# Patient Record
Sex: Female | Born: 1995 | Race: White | Hispanic: No | Marital: Single | Smoking: Former smoker
Health system: Southern US, Community
[De-identification: ages and names within clinical notes are randomized; demographics above are authoritative.]

## PROBLEM LIST (undated history)

## (undated) DIAGNOSIS — F329 Major depressive disorder, single episode, unspecified: Secondary | ICD-10-CM

## (undated) DIAGNOSIS — Z789 Other specified health status: Secondary | ICD-10-CM

## (undated) DIAGNOSIS — B999 Unspecified infectious disease: Secondary | ICD-10-CM

## (undated) DIAGNOSIS — N809 Endometriosis, unspecified: Secondary | ICD-10-CM

## (undated) DIAGNOSIS — E282 Polycystic ovarian syndrome: Secondary | ICD-10-CM

## (undated) DIAGNOSIS — F419 Anxiety disorder, unspecified: Secondary | ICD-10-CM

## (undated) DIAGNOSIS — L732 Hidradenitis suppurativa: Secondary | ICD-10-CM

## (undated) DIAGNOSIS — F32A Depression, unspecified: Secondary | ICD-10-CM

## (undated) HISTORY — DX: Hidradenitis suppurativa: L73.2

## (undated) HISTORY — PX: WISDOM TOOTH EXTRACTION: SHX21

## (undated) HISTORY — PX: NO PAST SURGERIES: SHX2092

## (undated) HISTORY — DX: Anxiety disorder, unspecified: F41.9

---
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
## 2016-06-09 ENCOUNTER — Encounter (HOSPITAL_COMMUNITY): Payer: Self-pay | Admitting: Emergency Medicine

## 2016-06-09 ENCOUNTER — Inpatient Hospital Stay (HOSPITAL_COMMUNITY)
Admission: AD | Admit: 2016-06-09 | Discharge: 2016-06-13 | DRG: 881 | Disposition: A | Discharge status: Home / Self Care | Payer: Federal, State, Local not specified - PPO | Source: Intra-hospital | Attending: Psychiatry | Admitting: Psychiatry

## 2016-06-09 DIAGNOSIS — F4310 Post-traumatic stress disorder, unspecified: Secondary | ICD-10-CM | POA: Diagnosis present

## 2016-06-09 DIAGNOSIS — F1210 Cannabis abuse, uncomplicated: Secondary | ICD-10-CM | POA: Diagnosis present

## 2016-06-09 DIAGNOSIS — K219 Gastro-esophageal reflux disease without esophagitis: Secondary | ICD-10-CM | POA: Diagnosis present

## 2016-06-09 DIAGNOSIS — F17200 Nicotine dependence, unspecified, uncomplicated: Secondary | ICD-10-CM | POA: Diagnosis present

## 2016-06-09 DIAGNOSIS — G4700 Insomnia, unspecified: Secondary | ICD-10-CM | POA: Diagnosis present

## 2016-06-09 DIAGNOSIS — N390 Urinary tract infection, site not specified: Secondary | ICD-10-CM | POA: Diagnosis present

## 2016-06-09 DIAGNOSIS — Z79899 Other long term (current) drug therapy: Secondary | ICD-10-CM | POA: Diagnosis not present

## 2016-06-09 DIAGNOSIS — F322 Major depressive disorder, single episode, severe without psychotic features: Secondary | ICD-10-CM | POA: Diagnosis not present

## 2016-06-09 DIAGNOSIS — R45851 Suicidal ideations: Secondary | ICD-10-CM | POA: Diagnosis present

## 2016-06-09 DIAGNOSIS — F17210 Nicotine dependence, cigarettes, uncomplicated: Secondary | ICD-10-CM | POA: Diagnosis not present

## 2016-06-09 DIAGNOSIS — Z818 Family history of other mental and behavioral disorders: Secondary | ICD-10-CM | POA: Diagnosis not present

## 2016-06-09 DIAGNOSIS — F4001 Agoraphobia with panic disorder: Secondary | ICD-10-CM | POA: Diagnosis present

## 2016-06-09 DIAGNOSIS — F1010 Alcohol abuse, uncomplicated: Secondary | ICD-10-CM | POA: Diagnosis present

## 2016-06-09 DIAGNOSIS — Z9114 Patient's other noncompliance with medication regimen: Secondary | ICD-10-CM | POA: Diagnosis not present

## 2016-06-09 DIAGNOSIS — F329 Major depressive disorder, single episode, unspecified: Secondary | ICD-10-CM | POA: Diagnosis present

## 2016-06-09 HISTORY — DX: Other specified health status: Z78.9

## 2016-06-09 MED ORDER — NICOTINE POLACRILEX 2 MG MT GUM
2.0000 mg | CHEWING_GUM | OROMUCOSAL | Status: DC | PRN
  Administered 2016-06-09 – 2016-06-12 (×8): 2 mg via ORAL
  Filled 2016-06-09 (×2): qty 1
  Filled 2016-06-09: qty 7
  Filled 2016-06-09 (×4): qty 1

## 2016-06-09 MED ORDER — ALUM & MAG HYDROXIDE-SIMETH 200-200-20 MG/5ML PO SUSP
30.0000 mL | ORAL | Status: DC | PRN
  Administered 2016-06-10 – 2016-06-13 (×3): 30 mL via ORAL
  Filled 2016-06-09 (×3): qty 30

## 2016-06-09 MED ORDER — MAGNESIUM HYDROXIDE 400 MG/5ML PO SUSP
30.0000 mL | Freq: Every day | ORAL | Status: DC | PRN
  Administered 2016-06-12: 30 mL via ORAL
  Filled 2016-06-09: qty 30

## 2016-06-09 MED ORDER — ACETAMINOPHEN 325 MG PO TABS
650.0000 mg | ORAL_TABLET | Freq: Four times a day (QID) | ORAL | Status: DC | PRN
  Administered 2016-06-11 – 2016-06-13 (×3): 650 mg via ORAL
  Filled 2016-06-09 (×3): qty 2

## 2016-06-09 MED ORDER — CIPROFLOXACIN HCL 500 MG PO TABS
500.0000 mg | ORAL_TABLET | Freq: Two times a day (BID) | ORAL | Status: DC
  Administered 2016-06-09 – 2016-06-13 (×8): 500 mg via ORAL
  Filled 2016-06-09 (×13): qty 1

## 2016-06-09 MED ORDER — ONDANSETRON HCL 4 MG PO TABS
4.0000 mg | ORAL_TABLET | Freq: Three times a day (TID) | ORAL | Status: DC | PRN
  Administered 2016-06-09 – 2016-06-13 (×6): 4 mg via ORAL
  Filled 2016-06-09 (×6): qty 1

## 2016-06-09 MED ORDER — ESCITALOPRAM OXALATE 10 MG PO TABS
10.0000 mg | ORAL_TABLET | Freq: Every day | ORAL | Status: DC
  Filled 2016-06-09 (×3): qty 1

## 2016-06-09 MED ORDER — ESCITALOPRAM OXALATE 10 MG PO TABS
10.0000 mg | ORAL_TABLET | Freq: Every day | ORAL | Status: DC
  Administered 2016-06-09 – 2016-06-10 (×2): 10 mg via ORAL
  Filled 2016-06-09 (×4): qty 1

## 2016-06-09 MED ORDER — ENSURE ENLIVE PO LIQD
237.0000 mL | Freq: Two times a day (BID) | ORAL | Status: DC
  Administered 2016-06-10 – 2016-06-11 (×2): 237 mL via ORAL

## 2016-06-09 MED ORDER — TRAZODONE HCL 50 MG PO TABS
50.0000 mg | ORAL_TABLET | Freq: Every evening | ORAL | Status: DC | PRN
  Administered 2016-06-09 – 2016-06-10 (×2): 50 mg via ORAL
  Filled 2016-06-09 (×2): qty 1
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
## 2016-06-09 NOTE — Progress Notes (Signed)
Adult Psychoeducational Group Note  Date:  06/09/2016 Time:  10:10 PM  Group Topic/Focus:  Wrap-Up Group:   The focus of this group is to help patients review their daily goal of treatment and discuss progress on daily workbooks.   Participation Level:  Active  Participation Quality:  Appropriate  Affect:  Appropriate  Cognitive:  Alert  Insight: Appropriate  Engagement in Group:  Engaged  Modes of Intervention:  Discussion  Additional Comments:  Patient admitted today. Patient states she did not have a bad day, but it is very different being here. Gurnoor Sloop L Yuvan Medinger 06/09/2016, 10:10 PM

## 2016-06-10 DIAGNOSIS — F4310 Post-traumatic stress disorder, unspecified: Secondary | ICD-10-CM

## 2016-06-10 DIAGNOSIS — F322 Major depressive disorder, single episode, severe without psychotic features: Secondary | ICD-10-CM

## 2016-06-10 MED ORDER — PANTOPRAZOLE SODIUM 40 MG PO TBEC
40.0000 mg | DELAYED_RELEASE_TABLET | Freq: Every day | ORAL | Status: DC
Start: 2016-06-10 — End: 2016-06-13
  Administered 2016-06-10 – 2016-06-13 (×4): 40 mg via ORAL
  Filled 2016-06-10 (×6): qty 1

## 2016-06-10 NOTE — Progress Notes (Signed)
Recreation Therapy Notes  Date: 06/10/16 Time: 0945 Location: 300 Hall Group Room  Group Topic: Stress Management  Goal Area(s) Addresses:  Patient will verbalize importance of using healthy stress management.  Patient will identify positive emotions associated with healthy stress management.   Intervention: Stress Management  Activity :  Progressive Muscle Relaxation.  LRT introduced the technique of progressive muscle relaxation to patients.  Patients were to follow along as LRT read script to engaged in the technique.  Education:  Stress Management, Discharge Planning.   Education Outcome: Acknowledges edcuation/In group clarification offered/Needs additional education  Clinical Observations/Feedback: Pt did not attend group.    Breely Panik, LRT/CTRS    Jahzir Strohmeier A 06/10/2016 12:46 PM 

## 2016-06-10 NOTE — [facility] Group Notes (Signed)
[facility] LCSW Group Therapy 06/10/2016 1:15pm  Type of Therapy: Group Therapy- Feelings Around Relapse and Recovery  Participation Level: Minimal  Participation Quality:  Attentive  Affect:  Appropriate  Cognitive: Alert and Oriented   Insight:  Unable to assess  Engagement in Therapy: Minimal  Modes of Intervention: Clarification, Confrontation, Discussion, Education, Exploration, Limit-setting, Orientation, Problem-solving, Rapport Building, Dance movement psychotherapisteality Testing, Socialization and Support  Summary of Progress/Problems: The topic for today was feelings about relapse. The group discussed what relapse prevention is to them and identified triggers that they are on the path to relapse. Members also processed their feeling towards relapse and were able to relate to common experiences. Group also discussed coping skills that can be used for relapse prevention.     Therapeutic Modalities:   Cognitive Behavioral Therapy Solution-Focused Therapy Assertiveness Training Relapse Prevention Therapy    Marilyn SprinklesLauren Richardson, LCSWA [id number] 06/10/2016 6:15 PM
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
## 2016-06-10 NOTE — Tx Team (Signed)
Interdisciplinary Treatment and Diagnostic Plan Update  06/10/2016 Time of Session: 4:32 PM  Marilyn Richardson MRN: [id number]  Principal Diagnosis: Post-Traumatic Stress Disorder  Secondary Diagnoses: Active Problems:   MDD (major depressive disorder) (HCC)   Current Medications:  Current Facility-Administered Medications  Medication Dose Route Frequency Provider Last Rate Last Dose  . acetaminophen (TYLENOL) tablet 650 mg  650 mg Oral Q6H PRN Sheila Agustin, NP      . alum & mag hydroxide-simeth (MAALOX/MYLANTA) 200-200-20 MG/5ML suspension 30 mL  30 mL Oral Q4H PRN Sheila Agustin, NP      . ciprofloxacin (CIPRO) tablet 500 mg  500 mg Oral BID Sheila Agustin, NP   500 mg at 06/10/16 0822  . escitalopram (LEXAPRO) tablet 10 mg  10 mg Oral QHS Sheila Agustin, NP   10 mg at 06/09/16 2118  . feeding supplement (ENSURE ENLIVE) (ENSURE ENLIVE) liquid 237 mL  237 mL Oral BID BM Sheila Agustin, NP      . magnesium hydroxide (MILK OF MAGNESIA) suspension 30 mL  30 mL Oral Daily PRN Sheila Agustin, NP      . nicotine polacrilex (NICORETTE) gum 2 mg  2 mg Oral PRN Sheila Agustin, NP   2 mg at 06/10/16 1315  . ondansetron (ZOFRAN) tablet 4 mg  4 mg Oral Q8H PRN Sheila Agustin, NP   4 mg at 06/10/16 1315  . traZODone (DESYREL) tablet 50 mg  50 mg Oral QHS PRN Sheila Agustin, NP   50 mg at 06/09/16 2253    PTA Medications: Prescriptions Prior to Admission  Medication Sig Dispense Refill Last Dose  . escitalopram (LEXAPRO) 10 MG tablet Take 10 mg by mouth daily.   06/09/2016 at Unknown time  . ibuprofen (ADVIL,MOTRIN) 200 MG tablet Take 200-400 mg by mouth every 6 (six) hours as needed for moderate pain.   unknown    Treatment Modalities: Medication Management, Group therapy, Case management,  1 to 1 session with clinician, Psychoeducation, Recreational therapy.   Physician Treatment Plan for Primary Diagnosis: Post-Traumatic Stress Disorder Long Term Goal(s): Improvement in symptoms so as  ready for discharge  Short Term Goals: Ability to verbalize feelings will improve, Ability to demonstrate self-control will improve and Ability to identify and develop effective coping behaviors will improve  Medication Management: Evaluate patient's response, side effects, and tolerance of medication regimen.  Therapeutic Interventions: 1 to 1 sessions, Unit Group sessions and Medication administration.  Evaluation of Outcomes: Not Met  Physician Treatment Plan for Secondary Diagnosis: Active Problems:   MDD (major depressive disorder) (HCC)   Long Term Goal(s): Improvement in symptoms so as ready for discharge  Short Term Goals: Ability to verbalize feelings will improve, Ability to disclose and discuss suicidal ideas and Ability to demonstrate self-control will improve  Medication Management: Evaluate patient's response, side effects, and tolerance of medication regimen.  Therapeutic Interventions: 1 to 1 sessions, Unit Group sessions and Medication administration.  Evaluation of Outcomes: Not Met   RN Treatment Plan for Primary Diagnosis: Post-Traumatic Stress Disorder Long Term Goal(s): Knowledge of disease and therapeutic regimen to maintain health will improve  Short Term Goals: Ability to remain free from injury will improve, Ability to disclose and discuss suicidal ideas and Ability to identify and develop effective coping behaviors will improve  Medication Management: RN will administer medications as ordered by provider, will assess and evaluate patient's response and provide education to patient for prescribed medication. RN will report any adverse and/or side effects to prescribing provider.  Therapeutic   Interventions: 1 on 1 counseling sessions, Psychoeducation, Medication administration, Evaluate responses to treatment, Monitor vital signs and CBGs as ordered, Perform/monitor CIWA, COWS, AIMS and Fall Risk screenings as ordered, Perform wound care treatments as  ordered.  Evaluation of Outcomes: Not Met   LCSW Treatment Plan for Primary Diagnosis: <principal problem not specified> Long Term Goal(s): Safe transition to appropriate next level of care at discharge, Engage patient in therapeutic group addressing interpersonal concerns.  Short Term Goals: Engage patient in aftercare planning with referrals and resources, Increase ability to appropriately verbalize feelings, Facilitate patient progression through stages of change regarding substance use diagnoses and concerns and Increase skills for wellness and recovery  Therapeutic Interventions: Assess for all discharge needs, 1 to 1 time with Social worker, Explore available resources and support systems, Assess for adequacy in community support network, Educate family and significant other(s) on suicide prevention, Complete Psychosocial Assessment, Interpersonal group therapy.  Evaluation of Outcomes: Not Met   Progress in Treatment: Attending groups: Pt is new to milieu, continuing to assess  Participating in groups: Pt is new to milieu, continuing to assess  Taking medication as prescribed: Yes, MD continues to assess for medication changes as needed Toleration medication: Yes, no side effects reported at this time Family/Significant other contact made: No, CSW assessing for appropriate contact Patient understands diagnosis: Continuing to assess Discussing patient identified problems/goals with staff: Yes Medical problems stabilized or resolved: Yes Denies suicidal/homicidal ideation: Yes Issues/concerns per patient self-inventory: None Other: N/A  New problem(s) identified: None identified at this time.   New Short Term/Long Term Goal(s): None identified at this time.   Discharge Plan or Barriers: Pt will return home and follow-up with outpatient services.   Reason for Continuation of Hospitalization: Anxiety Depression Medication stabilization Suicidal ideation  Estimated Length of  Stay: 3-5 days  Attendees: Patient: 06/10/2016  4:32 PM  Physician: Dr. Parke Poisson 06/10/2016  4:32 PM  Nursing:  06/10/2016  4:32 PM  RN Care Manager: Lars Pinks, RN 06/10/2016  4:32 PM  Social Worker: Peri Maris, LCSW; Erasmo Downer Drinkard, LCSW 06/10/2016  4:32 PM  Recreational Therapist:  06/10/2016  4:32 PM  Other: Lindell Spar, NP; Samuel Jester, NP 06/10/2016  4:32 PM  Other:  06/10/2016  4:32 PM  Other: 06/10/2016  4:32 PM    Scribe for Treatment Team: Bo Mcclintock, LCSW 06/10/2016 4:32 PM

## 2016-06-10 NOTE — Progress Notes (Signed)
D Zo is peasant, cooperative and quite talkative. She is a good conversationalist. She makes good eye contact. She takes her daily meds as scheduled this am and then she completed her daily asssessment and on it she wrote ssshe deneis SI today and she rated her depression, hopelessness and anxiety " 2/0/3", respectively. R Safety in place.

## 2016-06-10 NOTE — Progress Notes (Signed)
Nutrition Brief Note  Patient identified on the Malnutrition Screening Tool (MST) Report  Patient admitted with depression and SI. Pt reports losing 40 lb since February, this is a 20% wt loss x 6 months which is significant for time frame. Pt has been ordered ensure supplements, will continue order.  Wt Readings from Last 15 Encounters:  06/09/16 158 lb (71.7 kg)    Body mass index is 27.12 kg/m. Patient meets criteria for overweight based on current BMI.   Diet Order: Diet regular Room service appropriate? Yes; Fluid consistency: Thin Pt is also offered choice of unit snacks mid-morning and mid-afternoon.  Pt is eating as desired.    Labs and medications reviewed.   No nutrition interventions warranted at this time. If nutrition issues arise, please consult RD.   Tilda FrancoLindsey Shaheer Bonfield, MS, RD, LDN Pager: [phone] After Hours Pager: [phone]

## 2016-06-11 MED ORDER — ESCITALOPRAM OXALATE 20 MG PO TABS
20.0000 mg | ORAL_TABLET | Freq: Every day | ORAL | Status: DC
  Administered 2016-06-12 – 2016-06-13 (×2): 20 mg via ORAL
  Filled 2016-06-11 (×4): qty 1

## 2016-06-11 MED ORDER — ESCITALOPRAM OXALATE 20 MG PO TABS
20.0000 mg | ORAL_TABLET | Freq: Every day | ORAL | Status: DC
  Filled 2016-06-11: qty 1

## 2016-06-11 MED ORDER — ZOLPIDEM TARTRATE 5 MG PO TABS
5.0000 mg | ORAL_TABLET | Freq: Every evening | ORAL | Status: DC | PRN
  Administered 2016-06-11 – 2016-06-12 (×2): 5 mg via ORAL
  Filled 2016-06-11 (×2): qty 1

## 2016-06-11 MED ORDER — TRAZODONE HCL 100 MG PO TABS: 100.0000 mg | ORAL_TABLET | Freq: Every evening | ORAL | Status: DC | PRN
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
## 2016-06-11 NOTE — Progress Notes (Signed)
D: Patient's self inventory sheet: patient has poor sleep, requested and recieved sleep medication.poor  Appetite, low energy level, poor concentration. Rated depression 2/10, hopeless 0/10, anxiety 5/10. SI/HI/AVH: Denies. Physical complaints are stomach and head pain, nausea, poor appetite. Goal is "focussing on myself". Plans to work on "realize what I need".   A: Medications administered, assessed medication knowledge and education given on medication regimen.  Emotional support and encouragement given patient. R: Denies SI and HI , contracts for safety. Safety maintained with 15 minute checks.
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
## 2016-06-11 NOTE — Progress Notes (Signed)
Adult Psychoeducational Group Note  Date:  06/11/2016 Time:  10:52 PM  Group Topic/Focus:  Wrap-Up Group:   The focus of this group is to help patients review their daily goal of treatment and discuss progress on daily workbooks.   Participation Level:  Active  Participation Quality:  Appropriate  Affect:  Appropriate  Cognitive:  Alert and Appropriate  Insight: Appropriate  Engagement in Group:  Engaged and Supportive  Modes of Intervention:  Discussion  Additional Comments:  Patient reported wanting to take her medication and discharge.  Patient confirmed that she wanted increase her social interaction with others.  Elmore GuiseSLOAN, Ladd Cen N 06/11/2016, 10:52 PM

## 2016-06-11 NOTE — [facility] Group Notes (Signed)
[facility] LCSW Group Therapy  06/11/2016 10:00 until 11:00 AM  Type of Therapy:  Group Therapy  Participation Level:  Active  Participation Quality:  Attentive and Sharing  Affect:  Excited  Cognitive:  Alert and Oriented  Insight:  Developing/Improving  Engagement in Therapy:  Developing/Improving  Modes of Intervention:  Discussion, Exploration, Rapport Building, Socialization and Support  Summary of Progress/Problems:  The main focus of today's process group was for the patient to identify ways in which they have in the past sabotaged their own recovery. Motivational Interviewing was utilized to ask the group members what they get out of their self sabotaging behavior(s), and what reasons they may have for wanting to change. The Stages of Change were explained using a handout, and patients identified where they currently are with regard to stages of change. Patient was excited to be in group and required some redirection to avoid monopolizing group time. Patient shared she is in preparation stage of planning to refrain for self medication with alcohol and comply with medication. She reports her [place] use alcohol in excess.    Carney Bernatherine C Harrill, LCSW

## 2016-06-12 LAB — TSH: TSH: 1.984 u[IU]/mL (ref 0.350–4.500)
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
## 2016-06-12 NOTE — Progress Notes (Signed)
D: Patient's self inventory sheet: patient has good sleep, recieved sleep medication.fair  Appetite, normal energy level, poor concentration. Rated depression 2/10, hopeless 0/10, anxiety 4/10. SI/HI/AVH: Denies all. Physical complaints are 5/10 pain in stomach and head. Goal is "getting home". Plans to work on "talk to doc". Patient also said "I"m disappointed with the groups and activities [facility] has offered, I thought I'd be able to talk about my problems not lay in bed all day". Patient has been complaining of nausea and stomach bed and spends much of the day in bed. Took a tub bath today thinking that it would help her nausea.    A: Medications administered, assessed medication knowledge and education given on medication regimen.  Emotional support and encouragement given patient. R: Denies SI and HI , contracts for safety. Safety maintained with 15 minute checks.

## 2016-06-12 NOTE — Progress Notes (Signed)
Patient ID: Marilyn Richardson, female   DOB: [birth year], 20 y.o.   MRN: [id number] D: Client is visible on unit, seen multiple times on the phone. Client reports "I'm ready to go" I'm disappointed in the program" "I thought they was going to address all my problems, teach me coping skills" "I thought I was going to speak to a therapist or psychologist"  "I talk to the doctor he suggested outptpatient" A: Writer provided emotional support, discussed groups cover different topics each day. "I didn't go today"  encouraged her to participate and alert staff to different topics she'd like to know more about. Medications reviewed, administered as ordered. Staff will monitor q1815min for safety. R: Client is safe on the unit.

## 2016-06-12 NOTE — [facility] Group Notes (Signed)
    [facility] LCSW Group Therapy  06/12/2016 10:00 until 10:55 AM  Type of Therapy:  Group Therapy  Participation Level:  Did Not Attend; despite overhead announcement and face to face invite from CSW   Carney Bernatherine C Harrill, LCSW
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
## 2016-06-12 NOTE — [facility] Group Notes (Signed)
[facility] Group Notes:  (Nursing/MHT/Case Management/Adjunct)  Date:  06/12/2016  Time:  10:52 AM  Type of Therapy:  Nurse Education  Participation Level:  Did Not Attend  Summary of Progress/Problems: Pt did not attend but was invited.   Maurine SimmeringShugart, Jamareon Shimel M 06/12/2016, 10:52 AM

## 2016-06-13 DIAGNOSIS — F17210 Nicotine dependence, cigarettes, uncomplicated: Secondary | ICD-10-CM

## 2016-06-13 DIAGNOSIS — F329 Major depressive disorder, single episode, unspecified: Principal | ICD-10-CM

## 2016-06-13 DIAGNOSIS — Z79899 Other long term (current) drug therapy: Secondary | ICD-10-CM

## 2016-06-13 MED ORDER — ZOLPIDEM TARTRATE 5 MG PO TABS: 5.0000 mg | ORAL_TABLET | Freq: Every evening | ORAL | 0 refills | Status: DC | PRN

## 2016-06-13 MED ORDER — PANTOPRAZOLE SODIUM 40 MG PO TBEC: 40.0000 mg | DELAYED_RELEASE_TABLET | Freq: Every day | ORAL | 0 refills | Status: DC

## 2016-06-13 MED ORDER — NICOTINE POLACRILEX 2 MG MT GUM: 2.0000 mg | CHEWING_GUM | OROMUCOSAL | 0 refills | Status: DC | PRN

## 2016-06-13 MED ORDER — CIPROFLOXACIN HCL 500 MG PO TABS: 500.0000 mg | ORAL_TABLET | Freq: Two times a day (BID) | ORAL | 0 refills | Status: DC

## 2016-06-13 MED ORDER — ESCITALOPRAM OXALATE 20 MG PO TABS: 20.0000 mg | ORAL_TABLET | Freq: Every day | ORAL | 0 refills | Status: DC

## 2016-06-13 NOTE — Progress Notes (Signed)
Adult Psychoeducational Group Note  Date:  06/12/2016 Time:  10:51p.m.  Group Topic/Focus:  Wrap-Up Group:   The focus of this group is to help patients review their daily goal of treatment and discuss progress on daily workbooks.   Participation Level:  Active  Participation Quality:  Appropriate  Affect:  Anxious  Cognitive:  Alert, Appropriate and Oriented  Insight: Appropriate  Engagement in Group:  Engaged  Modes of Intervention:  Discussion  Additional Comments:  Patient attended wrap-up group and said she was anxious because she was going home tomorrow.  Her goal for today was to be open and have good communication.  For coping skills, she decided to use medications, talking and prayers.  Dvon Jiles Lacretia NicksW Demri Poulton 06/12/2016,10:51

## 2016-06-13 NOTE — Tx Team (Signed)
Interdisciplinary Treatment and Diagnostic Plan Update  06/13/2016 Time of Session: 11:35 AM  Marilyn Richardson MRN: [id number]  Principal Diagnosis: Post-Traumatic Stress Disorder  Secondary Diagnoses: Active Problems:   MDD (major depressive disorder) (HCC)   Current Medications:  Current Facility-Administered Medications  Medication Dose Route Frequency Provider Last Rate Last Dose  . acetaminophen (TYLENOL) tablet 650 mg  650 mg Oral Q6H PRN Adonis Brook, NP   650 mg at 06/13/16 0846  . alum & mag hydroxide-simeth (MAALOX/MYLANTA) 200-200-20 MG/5ML suspension 30 mL  30 mL Oral Q4H PRN Adonis Brook, NP   30 mL at 06/13/16 0939  . ciprofloxacin (CIPRO) tablet 500 mg  500 mg Oral BID Adonis Brook, NP   500 mg at 06/13/16 0844  . escitalopram (LEXAPRO) tablet 20 mg  20 mg Oral Daily Craige Cotta, MD   20 mg at 06/13/16 0843  . feeding supplement (ENSURE ENLIVE) (ENSURE ENLIVE) liquid 237 mL  237 mL Oral BID BM Adonis Brook, NP   237 mL at 06/11/16 1007  . magnesium hydroxide (MILK OF MAGNESIA) suspension 30 mL  30 mL Oral Daily PRN Adonis Brook, NP   30 mL at 06/12/16 1124  . nicotine polacrilex (NICORETTE) gum 2 mg  2 mg Oral PRN Adonis Brook, NP   2 mg at 06/12/16 1626  . ondansetron (ZOFRAN) tablet 4 mg  4 mg Oral Q8H PRN Adonis Brook, NP   4 mg at 06/13/16 0843  . pantoprazole (PROTONIX) EC tablet 40 mg  40 mg Oral Daily Craige Cotta, MD   40 mg at 06/13/16 0844  . zolpidem (AMBIEN) tablet 5 mg  5 mg Oral QHS PRN Craige Cotta, MD   5 mg at 06/12/16 2211    PTA Medications: Prescriptions Prior to Admission  Medication Sig Dispense Refill Last Dose  . escitalopram (LEXAPRO) 10 MG tablet Take 10 mg by mouth daily.   06/09/2016 at Unknown time  . ibuprofen (ADVIL,MOTRIN) 200 MG tablet Take 200-400 mg by mouth every 6 (six) hours as needed for moderate pain.   unknown    Treatment Modalities: Medication Management, Group therapy, Case management,  1 to 1  session with clinician, Psychoeducation, Recreational therapy.   Physician Treatment Plan for Primary Diagnosis: Post-Traumatic Stress Disorder Long Term Goal(s): Improvement in symptoms so as ready for discharge  Short Term Goals: Ability to verbalize feelings will improve, Ability to demonstrate self-control will improve and Ability to identify and develop effective coping behaviors will improve  Medication Management: Evaluate patient's response, side effects, and tolerance of medication regimen.  Therapeutic Interventions: 1 to 1 sessions, Unit Group sessions and Medication administration.  Evaluation of Outcomes: Adequate for Discharge  Physician Treatment Plan for Secondary Diagnosis: Active Problems:   MDD (major depressive disorder) (HCC)   Long Term Goal(s): Improvement in symptoms so as ready for discharge  Short Term Goals: Ability to verbalize feelings will improve, Ability to disclose and discuss suicidal ideas and Ability to demonstrate self-control will improve  Medication Management: Evaluate patient's response, side effects, and tolerance of medication regimen.  Therapeutic Interventions: 1 to 1 sessions, Unit Group sessions and Medication administration.  Evaluation of Outcomes: Adequate for Discharge   RN Treatment Plan for Primary Diagnosis: Post-Traumatic Stress Disorder Long Term Goal(s): Knowledge of disease and therapeutic regimen to maintain health will improve  Short Term Goals: Ability to remain free from injury will improve, Ability to disclose and discuss suicidal ideas and Ability to identify and develop effective coping behaviors  will improve  Medication Management: RN will administer medications as ordered by provider, will assess and evaluate patient's response and provide education to patient for prescribed medication. RN will report any adverse and/or side effects to prescribing provider.  Therapeutic Interventions: 1 on 1 counseling sessions,  Psychoeducation, Medication administration, Evaluate responses to treatment, Monitor vital signs and CBGs as ordered, Perform/monitor CIWA, COWS, AIMS and Fall Risk screenings as ordered, Perform wound care treatments as ordered.  Evaluation of Outcomes: Adequate for Discharge   LCSW Treatment Plan for Primary Diagnosis: PTSD Long Term Goal(s): Safe transition to appropriate next level of care at discharge, Engage patient in therapeutic group addressing interpersonal concerns.  Short Term Goals: Engage patient in aftercare planning with referrals and resources, Increase ability to appropriately verbalize feelings, Facilitate patient progression through stages of change regarding substance use diagnoses and concerns and Increase skills for wellness and recovery  Therapeutic Interventions: Assess for all discharge needs, 1 to 1 time with Social worker, Explore available resources and support systems, Assess for adequacy in community support network, Educate family and significant other(s) on suicide prevention, Complete Psychosocial Assessment, Interpersonal group therapy.  Evaluation of Outcomes: Adequate for Discharge   Progress in Treatment: Attending groups: Yes  Participating in groups: Minimally  Taking medication as prescribed: Yes, MD continues to assess for medication changes as needed Toleration medication: Yes, no side effects reported at this time Family/Significant other contact made: No, Pt declines Patient understands diagnosis: Continuing to assess Discussing patient identified problems/goals with staff: Yes Medical problems stabilized or resolved: Yes Denies suicidal/homicidal ideation: Yes Issues/concerns per patient self-inventory: None Other: N/A  New problem(s) identified: None identified at this time.   New Short Term/Long Term Goal(s): None identified at this time.   Discharge Plan or Barriers: Pt will return home and follow-up with outpatient services.   Reason  for Continuation of Hospitalization: Anxiety Depression Medication stabilization Suicidal ideation  Estimated Length of Stay:  0 days  Attendees: Patient: 06/13/2016  11:35 AM  Physician: Dr. Hilaria OtaLekwuawa 06/13/2016  11:35 AM  Nursing: Marzetta Boardhrista Dopson, RN 06/13/2016  11:35 AM  RN Care Manager: Onnie BoerJennifer Clark, RN 06/13/2016  11:35 AM  Social Worker: Chad CordialLauren Carter, LCSW; Kristin Drinkard, LCSW 06/13/2016  11:35 AM  Recreational Therapist:  06/13/2016  11:35 AM  Other: Armandina StammerAgnes Nwoko, NP; Gray BernhardtMay Augustin, NP 06/13/2016  11:35 AM  Other:  06/13/2016  11:35 AM  Other: 06/13/2016  11:35 AM    Scribe for Treatment Team: Elaina HoopsLauren M Carter, LCSW 06/13/2016 11:35 AM

## 2016-06-13 NOTE — Progress Notes (Signed)
  [facility] :  Will you be returning to the same living situation after discharge:  Yes,  Pt returning home At discharge, do you have transportation home?: Yes,  Pt grandfather to pick up Do you have the ability to pay for your medications: Yes,  Pt provided with prescriptions  Release of information consent forms completed and in the chart;  Patient's signature needed at discharge.  Patient to Follow up at: Follow-up Information    GRISSO,GREG, MD Follow up on 06/15/2016.   Specialty:  Internal Medicine Why:  Hospital discharge follow up at 2:30 PM on 8/30.  Bring discharge paperwork to this appointment.  Call to cancel/reschedule if needed.   Contact information: [address] [place] [id number] [phone]        [facility] Follow up on 06/14/2016.   Why:  Initial therapy appointment on 8/29 at 10 AM.   Please bring hospital discharge paperwork to this appointment.  Call to cancel/reschedule if needed.  This will be screening appt and appt w Ms Board will be scheduled at that time.   Contact information: [facility] [address]  [address] [place], [place] [id number] Fax:  [phone] Phone:  [phone] Phone:  [phone] Fax:  none          Next level of care provider has access to [facility] Link:no  Safety Planning and Suicide Prevention discussed: Yes,  with Pt; declines SPE  Have you used any form of tobacco in the last 30 days? (Cigarettes, Smokeless Tobacco, Cigars, and/or Pipes): Yes  Has patient been referred to the Quitline?: Patient refused referral  Patient has been referred for addiction treatment: Yes  Ireta Pullman Lavonna RuaM Carter 06/13/2016, 11:40 AM

## 2016-06-13 NOTE — [facility] Suicide Risk Assessment (Signed)
[facility]   Principal Problem: <principal problem not specified> Discharge Diagnoses:  Patient Active Problem List   Diagnosis Date Noted  . MDD (major depressive disorder) (HCC) [F32.9] 06/09/2016    Total Time spent with patient: 30 minutes  Musculoskeletal: Strength & Muscle Tone: within normal limits Gait & Station: normal Patient leans: N/A  Psychiatric Specialty Exam: ROS  Blood pressure 111/65, pulse (!) 58, temperature 98.4 F (36.9 C), temperature source Oral, resp. rate 17, height 5\' 4"  (1.626 m), weight 71.7 kg (158 lb), SpO2 99 %.Body mass index is 27.12 kg/m.  General Appearance: Casual  Eye Contact::  Good  Speech:  Normal Rate409  Volume:  Normal  Mood:  Euthymic  Affect:  Appropriate  Thought Process:  Coherent and Goal Directed  Orientation:  Full (Time, Place, and Person)  Thought Content:  Logical  Suicidal Thoughts:  No  Homicidal Thoughts:  No  Memory:  Immediate;   Good Recent;   Good Remote;   Good  Judgement:  Intact  Insight:  Good  Psychomotor Activity:  Normal  Concentration:  Good  Recall:  Good  Fund of Knowledge:Good  Language: Good  Akathisia:  No  Handed:  Left  AIMS (if indicated):     Assets:  Communication Skills Desire for Improvement Financial Resources/Insurance Housing Social Support Talents/Skills Transportation Vocational/Educational  Sleep:  Number of Hours: 4.75  Cognition: WNL  ADL's:  Intact   Mental Status Per Nursing Assessment::   On Admission:  Suicidal ideation indicated by patient, Intention to act on suicide plan  Demographic Factors:  Adolescent or young adult and Caucasian  Loss Factors: None  Historical Factors: Family history of mental illness or substance abuse  Risk Reduction Factors:   Sense of responsibility to family, Religious beliefs about death, Living with another person, especially a relative, Positive social support and Positive coping skills or problem  solving skills  Continued Clinical Symptoms:  Chronic abdominal upset with diarrea  Cognitive Features That Contribute To Risk:  None    Suicide Risk:  Minimal: No identifiable suicidal ideation.  Patients presenting with no risk factors but with morbid ruminations; may be classified as minimal risk based on the severity of the depressive symptoms  Follow-up Information    Marilyn Richardson,GREG, MD Follow up on 06/16/2016.   Specialty:  Internal Medicine Why:  Patient requests PCP follow up, CSW unable to schedule as office was closed.  Contact information: [address] [place] [id number] [phone]        [facility] Follow up on 06/14/2016.   Why:  Initial therapy appointment on 8/29 at 10 AM.   Please bring hospital discharge paperwork to this appointment.  Call to cancel/reschedule if needed.   Contact information:   Phone:  [phone] Fax:            Plan Of Care/Follow-up recommendations:  Activity:  mild to moderatexercise  Follow up with outpatient treatment plan  Marilyn BourgeoisUreh N Lekauwa, MD 06/13/2016, 10:15 AM

## 2016-06-13 NOTE — [facility] Group Notes (Signed)
[facility]   06/13/2016 1:12 PM  Participation Quality:  appropriate  Mood/Affect:  Excited, irritable   Thoughts of Suicide:  No Will you contract for safety?   NA  Current AVH:  No  Plan for Discharge/Comments:  Return home, follow up w PCP and therapist tomorrow  Transportation Means: parent   Supports: family and friends in the area  Sallee Langenne C Phillippe Orlick

## 2016-06-13 NOTE — [facility] Suicide Risk Assessment (Signed)
[facility] INPATIENT:  Family/Significant Other Suicide Prevention Education  Suicide Prevention Education:  Patient Refusal for Family/Significant Other Suicide Prevention Education: The patient Marilyn Richardson has refused to provide written consent for family/significant other to be provided Family/Significant Other Suicide Prevention Education during admission and/or prior to discharge.  Physician notified.  Sallee Langenne C Timarie Labell 06/13/2016, 10:59 AM

## 2016-06-13 NOTE — Progress Notes (Signed)
Recreation Therapy Notes  Date: 06/13/16 Time: 0930 Location: [address] Dayroom  Group Topic: Stress Management  Goal Area(s) Addresses:  Patient will verbalize importance of using healthy stress management.  Patient will identify positive emotions associated with healthy stress management.   Intervention: Stress Management  Activity :  [place].  LRT introduced to the technique of guided imagery to the patients.  Patients were to follow along as LRT read script in order to participate in the the technique.  Education: Stress Management, Discharge Planning.   Education Outcome: Acknowledges edcuation/In group clarification offered/Needs additional education  Clinical Observations/Feedback: Pt did not attend group.    Caroll RancherMarjette Norvel Wenker, LRT/CTRS     Caroll RancherLindsay, Phaedra Colgate A 06/13/2016 12:58 PM

## 2016-06-13 NOTE — Discharge Summary (Signed)
Physician Discharge Summary Note  Patient:  Marilyn Richardson is an 20 y.o., female MRN:  [id number] DOB:  09-Oct-1996 Patient phone:  [phone] (home)  Patient address:   [address] [place] [address],  Total Time spent with patient: 30 minutes  Date of Admission:  06/09/2016 Date of Discharge: 06/13/2016  Reason for Admission:  Depression, substance abuse  Principal Problem: MDD (major depressive disorder) [facility]) Discharge Diagnoses: Patient Active Problem List   Diagnosis Date Noted  . MDD (major depressive disorder) (HCC) [F32.9] 06/09/2016    Priority: High    Past Psychiatric History:  See HPI  Past Medical History:  Past Medical History:  Diagnosis Date  . Medical history non-contributory    History reviewed. No pertinent surgical history. Family History: History reviewed. No pertinent family history. Family Psychiatric  History: see HPI Social History:  History  Alcohol Use  . 1.2 oz/week  . 1 Glasses of wine, 1 Cans of beer per week    Comment: social drinker     History  Drug Use  . Types: Marijuana    Social History   Social History  . Marital status: Single    Spouse name: N/A  . Number of children: N/A  . Years of education: N/A   Social History Main Topics  . Smoking status: Current Every Day Smoker    Packs/day: 1.00    Years: 5.00  . Smokeless tobacco: Never Used  . Alcohol use 1.2 oz/week    1 Glasses of wine, 1 Cans of beer per week     Comment: social drinker  . Drug use:     Types: Marijuana  . Sexual activity: Yes    Birth control/ protection: None   Other Topics Concern  . None   Social History Narrative  . None    Hospital Course:  Marilyn Richardson, 20 yo admitted with depression.  She stated that she recently relocated from [facility] and started to abuse alcohol and "party".  She overdosed.  Marilyn Richardson was admitted for MDD (major depressive disorder) (HCC) and crisis management.  She was treated with medications .   Medical problems were identified and treated as needed.  Home medications were restarted as appropriate.  Improvement was monitored by observation and Marilyn Richardson daily report of symptom reduction.  Emotional and mental status was monitored by daily self inventory reports completed by Marilyn Richardson and clinical staff.  Patient reported continued improvement, denied any new concerns.  Patient had been compliant on medications and denied side effects.  Support and encouragement was provided.    At time of discharge, patient rated both depression and anxiety levels to be manageable and minimal.  Patient encouraged to attend groups to help with recognizing triggers of emotional crises and de-stabilizations.  Patient encouraged to attend group to help identify the positive things in life that would help in dealing with feelings of loss, depression and unhealthy or abusive tendencies.         Marilyn Richardson was evaluated by the treatment team for stability and plans for continued recovery upon discharge.  She was offered further treatment options upon discharge including Residential, Intensive Outpatient and Outpatient treatment.  She will follow up with agencies listed below for medication management and counseling.  Encouraged patient to maintain satisfactory support network and home environment.  Advised to adhere to medication compliance and outpatient treatment follow up.  Prescriptions provided.       Marilyn Richardson motivation was  an integral factor for scheduling further treatment.  Employment, transportation, bed availability, health status, family support, and any pending legal issues were also considered during her hospital stay.  Upon completion of this admission the patient was both mentally and medically stable for discharge denying suicidal/homicidal ideation, auditory/visual/tactile hallucinations, delusional thoughts and paranoia.      Physical Findings: AIMS: Facial and Oral  Movements Muscles of Facial Expression: None, normal Lips and Perioral Area: None, normal Jaw: None, normal Tongue: None, normal,Extremity Movements Upper (arms, wrists, hands, fingers): None, normal Lower (legs, knees, ankles, toes): None, normal, Trunk Movements Neck, shoulders, hips: None, normal, Overall Severity Severity of abnormal movements (highest score from questions above): None, normal Incapacitation due to abnormal movements: None, normal Patient's awareness of abnormal movements (rate only patient's report): No Awareness, Dental Status Current problems with teeth and/or dentures?: No Does patient usually wear dentures?: No  CIWA:    COWS:     Musculoskeletal: Strength & Muscle Tone: within normal limits Gait & Station: normal Patient leans: N/A  Psychiatric Specialty Exam:  See MD SRA Physical Exam  Vitals reviewed. Psychiatric: She has a normal mood and affect. Her speech is normal and behavior is normal. Judgment normal. Thought content is not paranoid. Cognition and memory are normal. She expresses no homicidal and no suicidal ideation.    Review of Systems  Constitutional: Negative.   HENT: Negative.   Eyes: Negative.   Respiratory: Negative.   Cardiovascular: Negative.   Gastrointestinal: Negative.   Genitourinary: Negative.   Musculoskeletal: Negative.   Skin: Negative.   Neurological: Negative.   Endo/Heme/Allergies: Negative.   Psychiatric/Behavioral: Negative.   All other systems reviewed and are negative.   Blood pressure 111/65, pulse (!) 58, temperature 98.4 F (36.9 C), temperature source Oral, resp. rate 17, height 5\' 4"  (1.626 m), weight 71.7 kg (158 lb), SpO2 99 %.Body mass index is 27.12 kg/m.   Have you used any form of tobacco in the last 30 days? (Cigarettes, Smokeless Tobacco, Cigars, and/or Pipes): Yes  Has this patient used any form of tobacco in the last 30 days? (Cigarettes, Smokeless Tobacco, Cigars, and/or Pipes) Yes, Rx given to  patient  Blood Alcohol level:  No results found for: [facility]  Metabolic Disorder Labs:  No results found for: HGBA1C, MPG No results found for: PROLACTIN No results found for: CHOL, TRIG, HDL, CHOLHDL, VLDL, LDLCALC  See Psychiatric Specialty Exam and Suicide Risk Assessment completed by Attending Physician prior to discharge.  Discharge destination:  Home  Is patient on multiple antipsychotic therapies at discharge:  No   Has Patient had three or more failed trials of antipsychotic monotherapy by history:  No  Recommended Plan for Multiple Antipsychotic Therapies: NA     Medication List    STOP taking these medications   ibuprofen 200 MG tablet Commonly known as:  ADVIL,MOTRIN     TAKE these medications     Indication  ciprofloxacin 500 MG tablet Commonly known as:  CIPRO Take 1 tablet (500 mg total) by mouth 2 (two) times daily.  Indication:  Urinary Tract Infection   escitalopram 20 MG tablet Commonly known as:  LEXAPRO Take 1 tablet (20 mg total) by mouth daily. What changed:  medication strength  how much to take  Indication:  Major Depressive Disorder   nicotine polacrilex 2 MG gum Commonly known as:  NICORETTE Take 1 each (2 mg total) by mouth as needed for smoking cessation.  Indication:  Nicotine Addiction   pantoprazole 40 MG  tablet Commonly known as:  PROTONIX Take 1 tablet (40 mg total) by mouth daily.  Indication:  Gastroesophageal Reflux Disease   zolpidem 5 MG tablet Commonly known as:  AMBIEN Take 1 tablet (5 mg total) by mouth at bedtime as needed for sleep. For insomnia  Indication:  Trouble Sleeping      Follow-up Information    GRISSO,GREG, MD Follow up on 06/15/2016.   Specialty:  Internal Medicine Why:  Hospital discharge follow up at 2:30 PM on 8/30.  Bring discharge paperwork to this appointment.  Call to cancel/reschedule if needed.   Contact information: [address] [place] [id number] [phone]        [facility] Follow up on 06/14/2016.   Why:  Initial therapy appointment on 8/29 at 10 AM.   Please bring hospital discharge paperwork to this appointment.  Call to cancel/reschedule if needed.  This will be screening appt and appt w Ms Board will be scheduled at that time.   Contact information: [facility] [address]  [address] [place], [place] [id number] Fax:  [phone] Phone:  [phone] Phone:  [phone] Fax:  none          Follow-up recommendations:  Activity:  as tol Diet:  as tol  Comments:  1.  Take all your medications as prescribed.   2.  Report any adverse side effects to outpatient provider. 3.  Patient instructed to not use alcohol or illegal drugs while on prescription medicines. 4.  In the event of worsening symptoms, instructed patient to call 911, the crisis hotline or go to nearest emergency room for evaluation of symptoms.  Signed: Lindwood QuaSheila May Galena Logie, NP [facility]  06/13/2016, 3:34 PM

## 2016-06-13 NOTE — Progress Notes (Signed)
Patient ID: Marilyn SergeDestini S Richardson, female   DOB: 1996-05-04, 20 y.o.   MRN: [id number]  Pt. Denies SI/HI and A/V hallucinations. Belongings returned to patient at time of discharge. Patient denies any pain or discomfort at time of discharge. Discharge instructions and medications were reviewed with patient. Patient verbalized understanding of both medications and discharge instructions. Patient discharged to lobby where her "paw paw" was there to pick her up. Q15 minute safety checks maintained until discharge. No distress upon discharge.

## 2017-03-30 DIAGNOSIS — R1012 Left upper quadrant pain: Secondary | ICD-10-CM | POA: Diagnosis not present

## 2017-03-30 DIAGNOSIS — R112 Nausea with vomiting, unspecified: Secondary | ICD-10-CM | POA: Diagnosis not present

## 2017-03-30 DIAGNOSIS — F12188 Cannabis abuse with other cannabis-induced disorder: Secondary | ICD-10-CM | POA: Diagnosis not present

## 2017-09-20 DIAGNOSIS — K080 Exfoliation of teeth due to systemic causes: Secondary | ICD-10-CM | POA: Diagnosis not present

## 2017-10-26 DIAGNOSIS — N760 Acute vaginitis: Secondary | ICD-10-CM | POA: Diagnosis not present

## 2017-10-26 DIAGNOSIS — N939 Abnormal uterine and vaginal bleeding, unspecified: Secondary | ICD-10-CM | POA: Diagnosis not present

## 2017-10-26 DIAGNOSIS — Z113 Encounter for screening for infections with a predominantly sexual mode of transmission: Secondary | ICD-10-CM | POA: Diagnosis not present

## 2017-10-26 DIAGNOSIS — Z6829 Body mass index (BMI) 29.0-29.9, adult: Secondary | ICD-10-CM | POA: Diagnosis not present

## 2017-10-26 DIAGNOSIS — Z01419 Encounter for gynecological examination (general) (routine) without abnormal findings: Secondary | ICD-10-CM | POA: Diagnosis not present

## 2017-11-01 ENCOUNTER — Telehealth: Payer: Self-pay | Admitting: Hematology and Oncology

## 2017-11-01 NOTE — Telephone Encounter (Signed)
Spoke with patient regarding appointment Date/Time/location/Ph# °

## 2017-11-22 ENCOUNTER — Inpatient Hospital Stay: Payer: Self-pay | Attending: Hematology and Oncology

## 2017-11-22 ENCOUNTER — Inpatient Hospital Stay
Payer: Federal, State, Local not specified - PPO | Attending: Hematology and Oncology | Admitting: Hematology and Oncology

## 2017-11-22 ENCOUNTER — Other Ambulatory Visit: Payer: Self-pay

## 2017-11-22 ENCOUNTER — Encounter: Payer: Self-pay | Admitting: Hematology and Oncology

## 2017-11-22 ENCOUNTER — Telehealth: Payer: Self-pay | Admitting: Hematology and Oncology

## 2017-11-22 VITALS — BP 136/76 | HR 97 | Temp 98.2°F | Resp 18 | Ht 64.0 in | Wt 175.8 lb

## 2017-11-22 DIAGNOSIS — Z720 Tobacco use: Secondary | ICD-10-CM | POA: Diagnosis not present

## 2017-11-22 DIAGNOSIS — Z832 Family history of diseases of the blood and blood-forming organs and certain disorders involving the immune mechanism: Secondary | ICD-10-CM | POA: Diagnosis not present

## 2017-11-22 NOTE — Telephone Encounter (Signed)
Gave avs sent to lab °

## 2017-11-27 LAB — FACTOR 5 LEIDEN

## 2017-12-17 ENCOUNTER — Encounter: Payer: Self-pay | Admitting: Hematology and Oncology

## 2017-12-17 DIAGNOSIS — Z832 Family history of diseases of the blood and blood-forming organs and certain disorders involving the immune mechanism: Secondary | ICD-10-CM | POA: Insufficient documentation

## 2017-12-17 NOTE — Assessment & Plan Note (Signed)
22 y.o.  Female with strong maternal family line history of factor V Leiden mutation.  As this mutation would have significant impact on the patient's health including safety of pregnancies due to increased risk of deep vein thrombosis and no situations, I would recommend patient to obtain testing for the mutation.  Without previous history of deep vein thrombosis, I do not recommend patient to be started on any prophylactic or therapeutic anticoagulation at this point in time.  Plan: - Proceed with lab work as outlined below. -Return to our clinic as needed.

## 2017-12-17 NOTE — Progress Notes (Signed)
[facility] New Visit:  Assessment: Family history of factor V Leiden mutation 22 y.o.  Female with strong maternal family line history of factor V Leiden mutation.  As this mutation would have significant impact on the patient's health including safety of pregnancies due to increased risk of deep vein thrombosis and no situations, I would recommend patient to obtain testing for the mutation.  Without previous history of deep vein thrombosis, I do not recommend patient to be started on any prophylactic or therapeutic anticoagulation at this point in time.  Plan: - Proceed with lab work as outlined below. -Return to our clinic as needed.   Voice recognition software was used and creation of this note. Despite my best effort at editing the text, some misspelling/errors may have occurred. Orders Placed This Encounter  Procedures  . Factor 5 Leiden*    Standing Status:   Future    Number of Occurrences:   1    Standing Expiration Date:   11/22/2018    All questions were answered.  . The patient knows to call the clinic with any problems, questions or concerns.  This note was electronically signed.    History of Presenting Illness Marilyn Richardson 22 y.o. [facility] for factor V Leiden family history.  Patient denies any personal history of deep vein thrombosis, pulmonary embolism.  She has never been pregnant.  In her family, her mother, maternal aunt, and maternal grandmother all have been diagnosed as factor V Leiden deficient.  No significant history of pregnancy loss, no history of deep vein thrombosis or pulmonary embolism in the aforementioned relatives.  Medical History: Past Medical History:  Diagnosis Date  . Medical history non-contributory     Surgical History: No past surgical history on file.  Family History: No family history on file.  Social History: Social History   Socioeconomic History  . Marital status: Single     Spouse name: Not on file  . Number of children: Not on file  . Years of education: Not on file  . Highest education level: Not on file  Social Needs  . Financial resource strain: Not on file  . Food insecurity - worry: Not on file  . Food insecurity - inability: Not on file  . Transportation needs - medical: Not on file  . Transportation needs - non-medical: Not on file  Occupational History  . Not on file  Tobacco Use  . Smoking status: Current Every Day Smoker    Packs/day: 1.00    Years: 5.00    Pack years: 5.00  . Smokeless tobacco: Never Used  Substance and Sexual Activity  . Alcohol use: Yes    Alcohol/week: 1.2 oz    Types: 1 Glasses of wine, 1 Cans of beer per week    Comment: social drinker  . Drug use: Yes    Types: Marijuana  . Sexual activity: Yes    Birth control/protection: None  Other Topics Concern  . Not on file  Social History Narrative  . Not on file    Allergies: Allergies  Allergen Reactions  . Other Hives    Plastic     Medications:  Current Outpatient Medications  Medication Sig Dispense Refill  . escitalopram (LEXAPRO) 20 MG tablet Take 1 tablet (20 mg total) by mouth daily. (Patient not taking: Reported on 11/22/2017) 30 tablet 0  . nicotine polacrilex (NICORETTE) 2 MG gum Take 1 each (2 mg total) by mouth as needed for  smoking cessation. (Patient not taking: Reported on 11/22/2017) 100 tablet 0  . pantoprazole (PROTONIX) 40 MG tablet Take 1 tablet (40 mg total) by mouth daily. (Patient not taking: Reported on 11/22/2017) 30 tablet 0   No current facility-administered medications for this visit.     Review of Systems: Review of Systems  All other systems reviewed and are negative.    PHYSICAL EXAMINATION Blood pressure 136/76, pulse 97, temperature 98.2 F (36.8 C), temperature source Oral, resp. rate 18, height 5\' 4"  (1.626 m), weight 175 lb 12.8 oz (79.7 kg), SpO2 100 %.  ECOG PERFORMANCE STATUS: 0 - Asymptomatic  Physical Exam   Constitutional: She is oriented to person, place, and time and well-developed, well-nourished, and in no distress. No distress.  HENT:  Head: Normocephalic and atraumatic.  Mouth/Throat: Oropharynx is clear and moist. No oropharyngeal exudate.  Eyes: Conjunctivae and EOM are normal. Pupils are equal, round, and reactive to light. No scleral icterus.  Neck: No thyromegaly present.  Cardiovascular: Normal rate, regular rhythm, normal heart sounds and intact distal pulses.  No murmur heard. Pulmonary/Chest: Breath sounds normal. No respiratory distress. She has no wheezes. She has no rales.  Abdominal: Soft. Bowel sounds are normal. She exhibits no distension and no mass. There is no tenderness. There is no guarding.  Musculoskeletal: She exhibits no edema.  Lymphadenopathy:    She has no cervical adenopathy.  Neurological: She is alert and oriented to person, place, and time. She has normal reflexes. No cranial nerve deficit.  Skin: Skin is warm and dry. No rash noted. She is not diaphoretic. No erythema. No pallor.     LABORATORY DATA: I have personally reviewed the data as listed: Appointment on 11/22/2017  Component Date Value Ref Range Status  . Recommendations-F5LEID: 11/22/2017 Comment   Final   Comment: (NOTE) Result:  Negative (no mutation found) Factor V Leiden is a specific mutation ([id number]) in the factor V gene that is associated with an increased risk of venous thrombosis. Factor V Leiden is more resistant to inactivation by activated protein C.  As a result, factor V persists in the circulation leading to a mild hyper- coagulable state.  The Leiden mutation accounts for 90% - 95% of APC resistance.  Factor V Leiden has been reported in patients with deep vein thrombosis, pulmonary embolus, central retinal vein occlusion, cerebral sinus thrombosis and hepatic vein thrombosis. Other risk factors to be considered in the workup for venous thrombosis include the G20210A  mutation in the factor II (prothrombin) gene, protein S and C deficiency, and antithrombin deficiencies. Anticardiolipin antibody and lupus anticoagulant analysis may be appropriate for certain patients, as well as homocysteine levels. Contact your local [facility] for information on how to order additi                          onal testing if desired. **Genetic counselors are available for health care providers to**  discuss results at [phone]-GENE [phone]). Methodology: DNA analysis of the Factor V gene was performed by allele-specific PCR. The diagnostic sensitivity and specificity is >99% for both. Molecular-based testing is highly accurate, but as in any laboratory test, diagnostic errors may occur. All test results must be combined with clinical information for the most accurate interpretation. This test was developed and its performance characteristics determined by [facility]. It has not been cleared or approved by the Food and Drug Administration. References: Voelkerding K (1996).  Clin Lab Med [phone]. Vincenza Hews, PhD, [facility]  Ernestene Mention, PhD, [facility] Wyatt Portela, M.S., PhD, [facility] Manya Silvas, PhD, [facility] Bonnielee Haff, PhD, [facility] Alpha Gula PhD, [facility] Performed At: [facility] [address] [place], [place] [id number] Oley Balm MD WU:[id number]                          7 Performed at [facility], [address] [place], [place] [address]          Daisy Blossom, MD

## 2018-03-28 DIAGNOSIS — K080 Exfoliation of teeth due to systemic causes: Secondary | ICD-10-CM | POA: Diagnosis not present

## 2018-05-22 DIAGNOSIS — N912 Amenorrhea, unspecified: Secondary | ICD-10-CM | POA: Diagnosis not present

## 2018-05-22 DIAGNOSIS — N760 Acute vaginitis: Secondary | ICD-10-CM | POA: Diagnosis not present

## 2018-05-22 DIAGNOSIS — Z113 Encounter for screening for infections with a predominantly sexual mode of transmission: Secondary | ICD-10-CM | POA: Diagnosis not present

## 2018-05-22 DIAGNOSIS — Z3200 Encounter for pregnancy test, result unknown: Secondary | ICD-10-CM | POA: Diagnosis not present

## 2018-05-22 DIAGNOSIS — N939 Abnormal uterine and vaginal bleeding, unspecified: Secondary | ICD-10-CM | POA: Diagnosis not present

## 2018-05-22 DIAGNOSIS — E282 Polycystic ovarian syndrome: Secondary | ICD-10-CM | POA: Diagnosis not present

## 2018-06-05 DIAGNOSIS — B079 Viral wart, unspecified: Secondary | ICD-10-CM | POA: Diagnosis not present

## 2018-08-16 DIAGNOSIS — R1031 Right lower quadrant pain: Secondary | ICD-10-CM | POA: Diagnosis not present

## 2018-08-16 DIAGNOSIS — E282 Polycystic ovarian syndrome: Secondary | ICD-10-CM | POA: Diagnosis not present

## 2018-08-16 DIAGNOSIS — Z113 Encounter for screening for infections with a predominantly sexual mode of transmission: Secondary | ICD-10-CM | POA: Diagnosis not present

## 2018-08-16 DIAGNOSIS — N390 Urinary tract infection, site not specified: Secondary | ICD-10-CM | POA: Diagnosis not present

## 2018-09-04 DIAGNOSIS — L732 Hidradenitis suppurativa: Secondary | ICD-10-CM | POA: Diagnosis not present

## 2018-09-04 DIAGNOSIS — E282 Polycystic ovarian syndrome: Secondary | ICD-10-CM | POA: Diagnosis not present

## 2018-10-01 DIAGNOSIS — K080 Exfoliation of teeth due to systemic causes: Secondary | ICD-10-CM | POA: Diagnosis not present

## 2018-11-02 DIAGNOSIS — R319 Hematuria, unspecified: Secondary | ICD-10-CM | POA: Diagnosis not present

## 2018-11-02 DIAGNOSIS — N760 Acute vaginitis: Secondary | ICD-10-CM | POA: Diagnosis not present

## 2018-11-02 DIAGNOSIS — Z6830 Body mass index (BMI) 30.0-30.9, adult: Secondary | ICD-10-CM | POA: Diagnosis not present

## 2018-11-02 DIAGNOSIS — Z01419 Encounter for gynecological examination (general) (routine) without abnormal findings: Secondary | ICD-10-CM | POA: Diagnosis not present

## 2018-11-02 DIAGNOSIS — Z3200 Encounter for pregnancy test, result unknown: Secondary | ICD-10-CM | POA: Diagnosis not present

## 2018-11-02 DIAGNOSIS — Z113 Encounter for screening for infections with a predominantly sexual mode of transmission: Secondary | ICD-10-CM | POA: Diagnosis not present

## 2018-11-28 DIAGNOSIS — J111 Influenza due to unidentified influenza virus with other respiratory manifestations: Secondary | ICD-10-CM | POA: Diagnosis not present

## 2018-11-28 DIAGNOSIS — J0100 Acute maxillary sinusitis, unspecified: Secondary | ICD-10-CM | POA: Diagnosis not present

## 2018-11-28 DIAGNOSIS — J029 Acute pharyngitis, unspecified: Secondary | ICD-10-CM | POA: Diagnosis not present

## 2018-12-28 DIAGNOSIS — Z6830 Body mass index (BMI) 30.0-30.9, adult: Secondary | ICD-10-CM | POA: Diagnosis not present

## 2018-12-28 DIAGNOSIS — F909 Attention-deficit hyperactivity disorder, unspecified type: Secondary | ICD-10-CM | POA: Diagnosis not present

## 2019-03-05 DIAGNOSIS — R222 Localized swelling, mass and lump, trunk: Secondary | ICD-10-CM | POA: Diagnosis not present

## 2019-03-05 DIAGNOSIS — F609 Personality disorder, unspecified: Secondary | ICD-10-CM | POA: Diagnosis not present

## 2019-03-22 DIAGNOSIS — R222 Localized swelling, mass and lump, trunk: Secondary | ICD-10-CM | POA: Diagnosis not present

## 2019-04-26 DIAGNOSIS — L700 Acne vulgaris: Secondary | ICD-10-CM | POA: Diagnosis not present

## 2019-04-26 DIAGNOSIS — Z79899 Other long term (current) drug therapy: Secondary | ICD-10-CM | POA: Diagnosis not present

## 2019-04-26 DIAGNOSIS — N390 Urinary tract infection, site not specified: Secondary | ICD-10-CM | POA: Diagnosis not present

## 2019-04-26 DIAGNOSIS — R109 Unspecified abdominal pain: Secondary | ICD-10-CM | POA: Diagnosis not present

## 2019-04-26 DIAGNOSIS — D72829 Elevated white blood cell count, unspecified: Secondary | ICD-10-CM | POA: Diagnosis not present

## 2019-04-26 DIAGNOSIS — K529 Noninfective gastroenteritis and colitis, unspecified: Secondary | ICD-10-CM | POA: Diagnosis not present

## 2019-04-26 DIAGNOSIS — F321 Major depressive disorder, single episode, moderate: Secondary | ICD-10-CM | POA: Diagnosis not present

## 2019-04-27 DIAGNOSIS — K529 Noninfective gastroenteritis and colitis, unspecified: Secondary | ICD-10-CM | POA: Diagnosis not present

## 2019-04-27 DIAGNOSIS — N390 Urinary tract infection, site not specified: Secondary | ICD-10-CM | POA: Diagnosis not present

## 2019-04-28 ENCOUNTER — Emergency Department (HOSPITAL_COMMUNITY)
Admission: EM | Admit: 2019-04-28 | Discharge: 2019-04-28 | Disposition: A | Discharge status: Home / Self Care | Payer: Federal, State, Local not specified - PPO | Attending: Emergency Medicine | Admitting: Emergency Medicine

## 2019-04-28 ENCOUNTER — Other Ambulatory Visit: Payer: Self-pay

## 2019-04-28 DIAGNOSIS — R1110 Vomiting, unspecified: Secondary | ICD-10-CM | POA: Diagnosis not present

## 2019-04-28 DIAGNOSIS — R197 Diarrhea, unspecified: Secondary | ICD-10-CM | POA: Diagnosis not present

## 2019-04-28 DIAGNOSIS — Z03818 Encounter for observation for suspected exposure to other biological agents ruled out: Secondary | ICD-10-CM | POA: Diagnosis not present

## 2019-04-28 DIAGNOSIS — F17210 Nicotine dependence, cigarettes, uncomplicated: Secondary | ICD-10-CM | POA: Diagnosis not present

## 2019-04-28 DIAGNOSIS — Z79899 Other long term (current) drug therapy: Secondary | ICD-10-CM | POA: Insufficient documentation

## 2019-04-28 DIAGNOSIS — K3184 Gastroparesis: Secondary | ICD-10-CM | POA: Diagnosis not present

## 2019-04-28 DIAGNOSIS — Z20828 Contact with and (suspected) exposure to other viral communicable diseases: Secondary | ICD-10-CM | POA: Insufficient documentation

## 2019-04-28 DIAGNOSIS — R1013 Epigastric pain: Secondary | ICD-10-CM | POA: Diagnosis not present

## 2019-04-28 LAB — URINALYSIS, ROUTINE W REFLEX MICROSCOPIC
Bacteria, UA: NONE SEEN
Bilirubin Urine: NEGATIVE
Glucose, UA: NEGATIVE mg/dL
Ketones, ur: 20 mg/dL — AB
Nitrite: NEGATIVE
Protein, ur: NEGATIVE mg/dL
Specific Gravity, Urine: 1.013 (ref 1.005–1.030)
pH: 8 (ref 5.0–8.0)

## 2019-04-28 LAB — RAPID URINE DRUG SCREEN, HOSP PERFORMED
Amphetamines: NOT DETECTED
Barbiturates: NOT DETECTED
Benzodiazepines: NOT DETECTED
Cocaine: NOT DETECTED
Opiates: NOT DETECTED
Tetrahydrocannabinol: POSITIVE — AB

## 2019-04-28 LAB — COMPREHENSIVE METABOLIC PANEL
ALT: 29 U/L (ref 0–44)
AST: 25 U/L (ref 15–41)
Albumin: 4.1 g/dL (ref 3.5–5.0)
Alkaline Phosphatase: 57 U/L (ref 38–126)
Anion gap: 13 (ref 5–15)
BUN: 5 mg/dL — ABNORMAL LOW (ref 6–20)
CO2: 19 mmol/L — ABNORMAL LOW (ref 22–32)
Calcium: 9.3 mg/dL (ref 8.9–10.3)
Chloride: 108 mmol/L (ref 98–111)
Creatinine, Ser: 0.93 mg/dL (ref 0.44–1.00)
GFR calc Af Amer: >60 mL/min (ref >60)
GFR calc non Af Amer: >60 mL/min (ref >60)
Glucose, Bld: 116 mg/dL — ABNORMAL HIGH (ref 70–99)
Potassium: 3.3 mmol/L — ABNORMAL LOW (ref 3.5–5.1)
Sodium: 140 mmol/L (ref 135–145)
Total Bilirubin: 0.9 mg/dL (ref 0.3–1.2)
Total Protein: 7 g/dL (ref 6.5–8.1)

## 2019-04-28 LAB — CBC WITH DIFFERENTIAL/PLATELET
Abs Immature Granulocytes: 0.05 10*3/uL (ref 0.00–0.07)
Basophils Absolute: 0 10*3/uL (ref 0.0–0.1)
Basophils Relative: 0 %
Eosinophils Absolute: 0 10*3/uL (ref 0.0–0.5)
Eosinophils Relative: 0 %
HCT: 39.5 % (ref 36.0–46.0)
Hemoglobin: 13.6 g/dL (ref 12.0–15.0)
Immature Granulocytes: 1 %
Lymphocytes Relative: 17 %
Lymphs Abs: 1.6 10*3/uL (ref 0.7–4.0)
MCH: 32.5 pg (ref 26.0–34.0)
MCHC: 34.4 g/dL (ref 30.0–36.0)
MCV: 94.3 fL (ref 80.0–100.0)
Monocytes Absolute: 0.9 10*3/uL (ref 0.1–1.0)
Monocytes Relative: 9 %
Neutro Abs: 7 10*3/uL (ref 1.7–7.7)
Neutrophils Relative %: 73 %
Platelets: 216 10*3/uL (ref 150–400)
RBC: 4.19 MIL/uL (ref 3.87–5.11)
RDW: 12.4 % (ref 11.5–15.5)
WBC: 9.5 10*3/uL (ref 4.0–10.5)
nRBC: 0 % (ref 0.0–0.2)

## 2019-04-28 LAB — LIPASE, BLOOD: Lipase: 42 U/L (ref 11–51)

## 2019-04-28 LAB — I-STAT BETA HCG BLOOD, ED (MC, WL, AP ONLY): I-stat hCG, quantitative: <5 m[IU]/mL (ref <5)

## 2019-04-28 LAB — SARS CORONAVIRUS 2 BY RT PCR (HOSPITAL ORDER, PERFORMED IN ~~LOC~~ HOSPITAL LAB): SARS Coronavirus 2: NEGATIVE

## 2019-04-28 LAB — LACTIC ACID, PLASMA
Lactic Acid, Venous: 3 mmol/L (ref 0.5–1.9)
Lactic Acid, Venous: 2.2 mmol/L (ref 0.5–1.9)

## 2019-04-28 MED ORDER — PANTOPRAZOLE SODIUM 20 MG PO TBEC: 20.0000 mg | DELAYED_RELEASE_TABLET | Freq: Every day | ORAL | 0 refills | Status: DC

## 2019-04-28 MED ORDER — METOCLOPRAMIDE HCL 5 MG/ML IJ SOLN
10.0000 mg | Freq: Once | INTRAMUSCULAR | Status: AC
  Administered 2019-04-28: 10 mg via INTRAVENOUS
  Filled 2019-04-28: qty 2

## 2019-04-28 MED ORDER — LACTATED RINGERS IV BOLUS
1000.0000 mL | Freq: Once | INTRAVENOUS | Status: AC
  Administered 2019-04-28: 1000 mL via INTRAVENOUS

## 2019-04-28 MED ORDER — POTASSIUM CHLORIDE 10 MEQ/100ML IV SOLN
10.0000 meq | Freq: Once | INTRAVENOUS | Status: AC
  Administered 2019-04-28: 10 meq via INTRAVENOUS
  Filled 2019-04-28: qty 100

## 2019-04-28 MED ORDER — CAPSAICIN 0.025 % EX CREA
TOPICAL_CREAM | Freq: Every day | CUTANEOUS | Status: DC | PRN
  Administered 2019-04-28: 1 via TOPICAL
  Filled 2019-04-28: qty 60

## 2019-04-28 MED ORDER — CAPSAICIN 0.075 % EX CREA: 1.0000 "application " | TOPICAL_CREAM | Freq: Four times a day (QID) | CUTANEOUS | 0 refills | Status: DC

## 2019-04-28 MED ORDER — HALOPERIDOL LACTATE 5 MG/ML IJ SOLN
2.0000 mg | Freq: Once | INTRAMUSCULAR | Status: AC
  Administered 2019-04-28: 2 mg via INTRAVENOUS
  Filled 2019-04-28: qty 1

## 2019-04-28 MED ORDER — METOCLOPRAMIDE HCL 10 MG PO TABS: 10.0000 mg | ORAL_TABLET | Freq: Four times a day (QID) | ORAL | 0 refills | Status: DC | PRN

## 2019-04-28 MED ORDER — PANTOPRAZOLE SODIUM 40 MG IV SOLR
40.0000 mg | Freq: Once | INTRAVENOUS | Status: AC
  Administered 2019-04-28: 40 mg via INTRAVENOUS
  Filled 2019-04-28: qty 40

## 2019-04-28 MED ORDER — DIPHENHYDRAMINE HCL 50 MG/ML IJ SOLN
25.0000 mg | Freq: Once | INTRAMUSCULAR | Status: AC
  Administered 2019-04-28: 25 mg via INTRAVENOUS
  Filled 2019-04-28: qty 1

## 2019-04-28 NOTE — ED Triage Notes (Signed)
Intermittent nausea and vomiting x 5 days, Diarrhea stools 4 days ago then no further BM.  On arrival having dry heaves.  LLQ and LUQ abd pain noted.  Denies chance of pregnancy.

## 2019-04-28 NOTE — ED Provider Notes (Signed)
[facility] EMERGENCY DEPARTMENT Provider Note   CSN: [id number] Arrival date & time: 04/28/19  0730    History   Chief Complaint Chief Complaint  Patient presents with  . Emesis  . Abdominal Pain    HPI Marilyn Richardson is a 23 y.o. female.     HPI Patient reports he does have prior history of episodes of gastroparesis and vomiting.  At one point, she had diagnosis of cannabis hyperemesis syndrome.  He reports however she has stopped smoking marijuana and her last use was 2 weeks ago.  1 week ago she started having recurrent episodes of vomiting and diarrhea.  She initially attributed to something she ate but the symptoms have now continued for over 5 days.  She has had chills.  She has not measured her temperature to know she has a fever.  She has some abdominal pain that is diffusely over the upper abdomen more to the left.  She has been [facility] but symptoms rebounded.  She reports she got a little better after hydration and treatment but vomiting and diarrhea resumed.  Patient was [facility] at [facility] Friday evening and admitted overnight to the hospital.  She reports she did have CT scans performed. Past Medical History:  Diagnosis Date  . Medical history non-contributory     Patient Active Problem List   Diagnosis Date Noted  . Family history of factor V Leiden mutation 12/17/2017  . MDD (major depressive disorder) 06/09/2016    No past surgical history on file.   OB History   No obstetric history on file.      Home Medications    Prior to Admission medications   Medication Sig Start Date End Date Taking? Authorizing Provider  amphetamine-dextroamphetamine (ADDERALL) 15 MG tablet Take 15 mg by mouth daily. 04/08/19  Yes [provider]  ondansetron (ZOFRAN-ODT) 4 MG disintegrating tablet Take 4 mg by mouth daily as needed for nausea.  04/26/19  Yes [provider]  promethazine (PHENERGAN) 25 MG tablet  Take 25 mg by mouth daily as needed for nausea.  04/26/19  Yes [provider]  zolpidem (AMBIEN) 5 MG tablet Take 5 mg by mouth daily as needed. 04/27/19  Yes [provider]  capsicum (ZOSTRIX) 0.075 % topical cream Apply 1 application topically 4 (four) times daily. Up to 4 times daily as needed for nausea and vomiting 04/28/19   Arby BarrettePfeiffer, Tywana Robotham, MD  escitalopram (LEXAPRO) 20 MG tablet Take 1 tablet (20 mg total) by mouth daily. Patient not taking: Reported on 11/22/2017 06/13/16   Adonis BrookAgustin, Sheila, NP  metoCLOPramide (REGLAN) 10 MG tablet Take 1 tablet (10 mg total) by mouth every 6 (six) hours as needed for nausea (nausea/headache). 04/28/19   Arby BarrettePfeiffer, Acadia Thammavong, MD  nicotine polacrilex (NICORETTE) 2 MG gum Take 1 each (2 mg total) by mouth as needed for smoking cessation. Patient not taking: Reported on 11/22/2017 06/13/16   Adonis BrookAgustin, Sheila, NP  pantoprazole (PROTONIX) 20 MG tablet Take 1 tablet (20 mg total) by mouth daily. 04/28/19   Arby BarrettePfeiffer, Breigh Annett, MD  pantoprazole (PROTONIX) 40 MG tablet Take 1 tablet (40 mg total) by mouth daily. Patient not taking: Reported on 11/22/2017 06/13/16   Adonis BrookAgustin, Sheila, NP    Family History Family History  Problem Relation Age of Onset  . Factor V Leiden deficiency Mother   . Factor V Leiden deficiency Paternal Grandmother   . Factor V Leiden deficiency Maternal Aunt     Social History  Social History   Tobacco Use  . Smoking status: Current Every Day Smoker    Packs/day: 1.00    Years: 5.00    Pack years: 5.00  . Smokeless tobacco: Never Used  Substance Use Topics  . Alcohol use: Yes    Alcohol/week: 2.0 standard drinks    Types: 1 Glasses of wine, 1 Cans of beer per week    Comment: social drinker  . Drug use: Yes    Types: Marijuana     Allergies   Other   Review of Systems Review of Systems 10 Systems reviewed and are negative for acute change except as noted in the HPI.  Physical Exam Updated Vital Signs BP 121/64    Pulse (!) 58   Temp 97.7 F (36.5 C) (Oral) Comment: Simultaneous filing. User may not have seen previous data. Comment (Src): Simultaneous filing. User may not have seen previous data.  Resp (!) 58 Comment: Simultaneous filing. User may not have seen previous data.  LMP 04/04/2019 (Exact Date)   SpO2 100%   Physical Exam Constitutional:      Comments: Patient is clinically well in appearance.  She is alert and nontoxic.  No respiratory distress.  HENT:     Head: Normocephalic and atraumatic.  Eyes:     Extraocular Movements: Extraocular movements intact.  Cardiovascular:     Rate and Rhythm: Normal rate and regular rhythm.  Pulmonary:     Effort: Pulmonary effort is normal.     Breath sounds: Normal breath sounds.  Abdominal:     Palpations: Abdomen is soft.     Comments: Abdomen is soft.  Patient endorses moderate discomfort to palpation in the epigastrium and left lateral quadrant.  No guarding.  Musculoskeletal: Normal range of motion.        General: No swelling or tenderness.     Comments: Extremities are normal and in good condition.  Skin:    General: Skin is warm and dry.     Capillary Refill: Capillary refill takes less than 2 seconds.     Findings: No rash.  Neurological:     General: No focal deficit present.     Mental Status: She is oriented to person, place, and time.     Coordination: Coordination normal.  Psychiatric:        Mood and Affect: Mood normal.      ED Treatments / Results  Labs (all labs ordered are listed, but only abnormal results are displayed) Labs Reviewed  COMPREHENSIVE METABOLIC PANEL - Abnormal; Notable for the following components:      Result Value   Potassium 3.3 (*)    CO2 19 (*)    Glucose, Bld 116 (*)    BUN 5 (*)    All other components within normal limits  LACTIC ACID, PLASMA - Abnormal; Notable for the following components:   Lactic Acid, Venous 3.0 (*)    All other components within normal limits  LACTIC ACID, PLASMA  - Abnormal; Notable for the following components:   Lactic Acid, Venous 2.2 (*)    All other components within normal limits  URINALYSIS, ROUTINE W REFLEX MICROSCOPIC - Abnormal; Notable for the following components:   Hgb urine dipstick SMALL (*)    Ketones, ur 20 (*)    Leukocytes,Ua MODERATE (*)    All other components within normal limits  RAPID URINE DRUG SCREEN, HOSP PERFORMED - Abnormal; Notable for the following components:   Tetrahydrocannabinol POSITIVE (*)    All other components  within normal limits  SARS CORONAVIRUS 2 (HOSPITAL ORDER, [facility])  GASTROINTESTINAL PANEL BY PCR, STOOL (REPLACES STOOL CULTURE)  C DIFFICILE QUICK SCREEN W PCR REFLEX  LIPASE, BLOOD  CBC WITH DIFFERENTIAL/PLATELET  URINALYSIS, ROUTINE W REFLEX MICROSCOPIC  I-STAT BETA HCG BLOOD, ED ([facility], [facility], [facility] ONLY)    EKG None  Radiology No results found.  Procedures Procedures (including critical care time)  Medications Ordered in ED Medications  capsaicin (ZOSTRIX) 3.354 % cream (1 application Topical Given 04/28/19 1315)  lactated ringers bolus 1,000 mL (0 mLs Intravenous Stopped 04/28/19 1009)  pantoprazole (PROTONIX) injection 40 mg (40 mg Intravenous Given 04/28/19 0823)  metoCLOPramide (REGLAN) injection 10 mg (10 mg Intravenous Given 04/28/19 0824)  diphenhydrAMINE (BENADRYL) injection 25 mg (25 mg Intravenous Given 04/28/19 0823)  haloperidol lactate (HALDOL) injection 2 mg (2 mg Intravenous Given 04/28/19 1006)  lactated ringers bolus 1,000 mL (1,000 mLs Intravenous New Bag/Given 04/28/19 1005)  lactated ringers bolus 1,000 mL (1,000 mLs Intravenous New Bag/Given 04/28/19 1257)  haloperidol lactate (HALDOL) injection 2 mg (2 mg Intravenous Given 04/28/19 1309)  potassium chloride 10 mEq in 100 mL IVPB (10 mEq Intravenous New Bag/Given 04/28/19 1315)     Initial Impression / Assessment and Plan / ED Course  I have reviewed the triage vital signs and the nursing notes.   Pertinent labs & imaging results that were available during my care of the patient were reviewed by me and considered in my medical decision making (see chart for details).       Patient has been treated for gastroparesis likely due to cannabis hyperemesis syndrome.  He reports last use 2 weeks ago but does continue to test positive.  Patient ultimately got much improvement over a number of hours of treatment with fluid resuscitation Reglan and Haldol with capsaicin cream.  At time of discharge patient is up and ambulatory and comfortable with no further dry heaves.  She is sipping fluids and eating crackers.  Final Clinical Impressions(s) / ED Diagnoses   Final diagnoses:  Gastroparesis    ED Discharge Orders         Ordered    capsicum (ZOSTRIX) 0.075 % topical cream  4 times daily     04/28/19 1443    metoCLOPramide (REGLAN) 10 MG tablet  Every 6 hours PRN     04/28/19 1443    pantoprazole (PROTONIX) 20 MG tablet  Daily     04/28/19 1443           Charlesetta Shanks, MD 04/28/19 1446

## 2019-04-28 NOTE — ED Notes (Signed)
Pt does admit to previous type episode with marijuana use.  States last use 2 weeks ago.  Previous issues as well with shrimp and did have ingestion prior as well.

## 2019-04-28 NOTE — ED Triage Notes (Signed)
Pt here for eval of abdominal pain and emesis since Wednesday, states she got sick after eating at a restaurant. Pt was seen at [facility] for this Friday/sat.

## 2019-04-28 NOTE — ED Notes (Signed)
Pt dry heaving.  Dr. Johnney Killian made aware.  Pt resting at this time.

## 2019-04-28 NOTE — Discharge Instructions (Signed)
1.  Take Reglan before meals and at nighttime for the next several days then as needed. 2.  Take Protonix every day for the next 2 weeks. 3.  You may apply capsaicin cream to your abdomen up to 4 times a day to help with nausea and vomiting. 4.  No marijuana use at all. 5.  Schedule a follow-up appointment with a family doctor.  If you do not have one use the referral number in your discharge instructions.

## 2019-04-28 NOTE — ED Notes (Signed)
Pt was admitted to [facility] overnight and treated, but continues to have sx.  Pt does report she had a CT scan with unknown results.

## 2019-04-28 NOTE — ED Notes (Signed)
Pt given Ice chips per RN.

## 2019-04-30 DIAGNOSIS — Z09 Encounter for follow-up examination after completed treatment for conditions other than malignant neoplasm: Secondary | ICD-10-CM | POA: Diagnosis not present

## 2019-04-30 DIAGNOSIS — R112 Nausea with vomiting, unspecified: Secondary | ICD-10-CM | POA: Diagnosis not present

## 2019-04-30 DIAGNOSIS — Z6828 Body mass index (BMI) 28.0-28.9, adult: Secondary | ICD-10-CM | POA: Diagnosis not present

## 2019-05-01 DIAGNOSIS — K080 Exfoliation of teeth due to systemic causes: Secondary | ICD-10-CM | POA: Diagnosis not present

## 2019-05-07 DIAGNOSIS — F909 Attention-deficit hyperactivity disorder, unspecified type: Secondary | ICD-10-CM | POA: Diagnosis not present

## 2019-07-10 DIAGNOSIS — K080 Exfoliation of teeth due to systemic causes: Secondary | ICD-10-CM | POA: Diagnosis not present

## 2019-08-21 DIAGNOSIS — Z6827 Body mass index (BMI) 27.0-27.9, adult: Secondary | ICD-10-CM | POA: Diagnosis not present

## 2019-08-21 DIAGNOSIS — Z1331 Encounter for screening for depression: Secondary | ICD-10-CM | POA: Diagnosis not present

## 2019-08-21 DIAGNOSIS — F909 Attention-deficit hyperactivity disorder, unspecified type: Secondary | ICD-10-CM | POA: Diagnosis not present

## 2019-08-21 DIAGNOSIS — F411 Generalized anxiety disorder: Secondary | ICD-10-CM | POA: Diagnosis not present

## 2019-09-09 DIAGNOSIS — O288 Other abnormal findings on antenatal screening of mother: Secondary | ICD-10-CM | POA: Diagnosis not present

## 2019-09-09 DIAGNOSIS — R112 Nausea with vomiting, unspecified: Secondary | ICD-10-CM | POA: Diagnosis not present

## 2019-09-09 DIAGNOSIS — Z3201 Encounter for pregnancy test, result positive: Secondary | ICD-10-CM | POA: Diagnosis not present

## 2019-09-09 DIAGNOSIS — R197 Diarrhea, unspecified: Secondary | ICD-10-CM | POA: Diagnosis not present

## 2019-09-09 DIAGNOSIS — O219 Vomiting of pregnancy, unspecified: Secondary | ICD-10-CM | POA: Diagnosis not present

## 2019-09-09 DIAGNOSIS — Z3A00 Weeks of gestation of pregnancy not specified: Secondary | ICD-10-CM | POA: Diagnosis not present

## 2019-09-09 DIAGNOSIS — R8271 Bacteriuria: Secondary | ICD-10-CM | POA: Diagnosis not present

## 2019-09-10 ENCOUNTER — Inpatient Hospital Stay (HOSPITAL_COMMUNITY): Payer: Federal, State, Local not specified - PPO

## 2019-09-10 ENCOUNTER — Encounter (HOSPITAL_COMMUNITY): Payer: Self-pay

## 2019-09-10 ENCOUNTER — Other Ambulatory Visit: Payer: Self-pay

## 2019-09-10 ENCOUNTER — Inpatient Hospital Stay (HOSPITAL_COMMUNITY)
Admission: AD | Admit: 2019-09-10 | Discharge: 2019-09-10 | Disposition: A | Discharge status: Home / Self Care | Payer: Federal, State, Local not specified - PPO | Attending: Obstetrics and Gynecology | Admitting: Obstetrics and Gynecology

## 2019-09-10 DIAGNOSIS — O3680X0 Pregnancy with inconclusive fetal viability, not applicable or unspecified: Secondary | ICD-10-CM

## 2019-09-10 DIAGNOSIS — K824 Cholesterolosis of gallbladder: Secondary | ICD-10-CM | POA: Insufficient documentation

## 2019-09-10 DIAGNOSIS — O26899 Other specified pregnancy related conditions, unspecified trimester: Secondary | ICD-10-CM | POA: Diagnosis not present

## 2019-09-10 DIAGNOSIS — Z3A00 Weeks of gestation of pregnancy not specified: Secondary | ICD-10-CM | POA: Insufficient documentation

## 2019-09-10 DIAGNOSIS — Z79899 Other long term (current) drug therapy: Secondary | ICD-10-CM | POA: Diagnosis not present

## 2019-09-10 DIAGNOSIS — R10812 Left upper quadrant abdominal tenderness: Secondary | ICD-10-CM | POA: Diagnosis not present

## 2019-09-10 DIAGNOSIS — O0991 Supervision of high risk pregnancy, unspecified, first trimester: Secondary | ICD-10-CM | POA: Diagnosis not present

## 2019-09-10 DIAGNOSIS — F329 Major depressive disorder, single episode, unspecified: Secondary | ICD-10-CM | POA: Insufficient documentation

## 2019-09-10 DIAGNOSIS — O219 Vomiting of pregnancy, unspecified: Secondary | ICD-10-CM

## 2019-09-10 DIAGNOSIS — F12188 Cannabis abuse with other cannabis-induced disorder: Secondary | ICD-10-CM | POA: Insufficient documentation

## 2019-09-10 DIAGNOSIS — Z87891 Personal history of nicotine dependence: Secondary | ICD-10-CM | POA: Diagnosis not present

## 2019-09-10 DIAGNOSIS — B373 Candidiasis of vulva and vagina: Secondary | ICD-10-CM | POA: Insufficient documentation

## 2019-09-10 DIAGNOSIS — B3731 Acute candidiasis of vulva and vagina: Secondary | ICD-10-CM

## 2019-09-10 DIAGNOSIS — R109 Unspecified abdominal pain: Secondary | ICD-10-CM | POA: Diagnosis not present

## 2019-09-10 HISTORY — DX: Unspecified infectious disease: B99.9

## 2019-09-10 HISTORY — DX: Polycystic ovarian syndrome: E28.2

## 2019-09-10 HISTORY — DX: Depression, unspecified: F32.A

## 2019-09-10 HISTORY — DX: Endometriosis, unspecified: N80.9

## 2019-09-10 LAB — CBC
HCT: 39.8 % (ref 36.0–46.0)
Hemoglobin: 13.4 g/dL (ref 12.0–15.0)
MCH: 32.8 pg (ref 26.0–34.0)
MCHC: 33.7 g/dL (ref 30.0–36.0)
MCV: 97.3 fL (ref 80.0–100.0)
Platelets: 230 10*3/uL (ref 150–400)
RBC: 4.09 MIL/uL (ref 3.87–5.11)
RDW: 12.9 % (ref 11.5–15.5)
WBC: 13.7 10*3/uL — ABNORMAL HIGH (ref 4.0–10.5)
nRBC: 0 % (ref 0.0–0.2)

## 2019-09-10 LAB — CBC WITH DIFFERENTIAL/PLATELET
Abs Immature Granulocytes: 0.08 10*3/uL — ABNORMAL HIGH (ref 0.00–0.07)
Basophils Absolute: 0 10*3/uL (ref 0.0–0.1)
Basophils Relative: 0 %
Eosinophils Absolute: 0 10*3/uL (ref 0.0–0.5)
Eosinophils Relative: 0 %
HCT: 36.9 % (ref 36.0–46.0)
Hemoglobin: 13 g/dL (ref 12.0–15.0)
Immature Granulocytes: 1 %
Lymphocytes Relative: 9 %
Lymphs Abs: 1.2 10*3/uL (ref 0.7–4.0)
MCH: 33.2 pg (ref 26.0–34.0)
MCHC: 35.2 g/dL (ref 30.0–36.0)
MCV: 94.1 fL (ref 80.0–100.0)
Monocytes Absolute: 0.8 10*3/uL (ref 0.1–1.0)
Monocytes Relative: 6 %
Neutro Abs: 11.5 10*3/uL — ABNORMAL HIGH (ref 1.7–7.7)
Neutrophils Relative %: 84 %
Platelets: 230 10*3/uL (ref 150–400)
RBC: 3.92 MIL/uL (ref 3.87–5.11)
RDW: 12.8 % (ref 11.5–15.5)
WBC: 13.6 10*3/uL — ABNORMAL HIGH (ref 4.0–10.5)
nRBC: 0 % (ref 0.0–0.2)

## 2019-09-10 LAB — COMPREHENSIVE METABOLIC PANEL
ALT: 19 U/L (ref 0–44)
AST: 25 U/L (ref 15–41)
Albumin: 4.3 g/dL (ref 3.5–5.0)
Alkaline Phosphatase: 54 U/L (ref 38–126)
Anion gap: 13 (ref 5–15)
BUN: 7 mg/dL (ref 6–20)
CO2: 19 mmol/L — ABNORMAL LOW (ref 22–32)
Calcium: 9.2 mg/dL (ref 8.9–10.3)
Chloride: 105 mmol/L (ref 98–111)
Creatinine, Ser: 0.99 mg/dL (ref 0.44–1.00)
GFR calc Af Amer: >60 mL/min (ref >60)
GFR calc non Af Amer: >60 mL/min (ref >60)
Glucose, Bld: 114 mg/dL — ABNORMAL HIGH (ref 70–99)
Potassium: 3.5 mmol/L (ref 3.5–5.1)
Sodium: 137 mmol/L (ref 135–145)
Total Bilirubin: 0.8 mg/dL (ref 0.3–1.2)
Total Protein: 7.2 g/dL (ref 6.5–8.1)

## 2019-09-10 LAB — RAPID URINE DRUG SCREEN, HOSP PERFORMED
Amphetamines: NOT DETECTED
Barbiturates: NOT DETECTED
Benzodiazepines: NOT DETECTED
Cocaine: NOT DETECTED
Opiates: NOT DETECTED
Tetrahydrocannabinol: POSITIVE — AB

## 2019-09-10 LAB — URINALYSIS, ROUTINE W REFLEX MICROSCOPIC
Bilirubin Urine: NEGATIVE
Glucose, UA: NEGATIVE mg/dL
Hgb urine dipstick: NEGATIVE
Ketones, ur: 80 mg/dL — AB
Leukocytes,Ua: NEGATIVE
Nitrite: NEGATIVE
Protein, ur: NEGATIVE mg/dL
Specific Gravity, Urine: 1.02 (ref 1.005–1.030)
pH: 7 (ref 5.0–8.0)

## 2019-09-10 LAB — LIPASE, BLOOD: Lipase: 39 U/L (ref 11–51)

## 2019-09-10 LAB — I-STAT BETA HCG BLOOD, ED (MC, WL, AP ONLY): I-stat hCG, quantitative: 43.7 m[IU]/mL — ABNORMAL HIGH (ref <5)

## 2019-09-10 LAB — WET PREP, GENITAL
Clue Cells Wet Prep HPF POC: NONE SEEN
Sperm: NONE SEEN
Trich, Wet Prep: NONE SEEN

## 2019-09-10 LAB — ABO/RH: ABO/RH(D): AB POS

## 2019-09-10 LAB — HCG, QUANTITATIVE, PREGNANCY: hCG, Beta Chain, Quant, S: 51 m[IU]/mL — ABNORMAL HIGH (ref <5)

## 2019-09-10 MED ORDER — FAMOTIDINE 10 MG PO TABS: 10.0000 mg | ORAL_TABLET | Freq: Two times a day (BID) | ORAL | 0 refills | Status: DC

## 2019-09-10 MED ORDER — LACTATED RINGERS IV BOLUS: 1000.0000 mL | Freq: Once | INTRAVENOUS | Status: DC

## 2019-09-10 MED ORDER — TERCONAZOLE 0.4 % VA CREA: 1.0000 | TOPICAL_CREAM | Freq: Every day | VAGINAL | 0 refills | Status: AC

## 2019-09-10 MED ORDER — METOCLOPRAMIDE HCL 5 MG/ML IJ SOLN
10.0000 mg | Freq: Once | INTRAMUSCULAR | Status: AC
  Administered 2019-09-10: 10 mg via INTRAVENOUS
  Filled 2019-09-10: qty 2

## 2019-09-10 MED ORDER — LACTATED RINGERS IV BOLUS
1000.0000 mL | Freq: Once | INTRAVENOUS | Status: AC
  Administered 2019-09-10: 1000 mL via INTRAVENOUS

## 2019-09-10 MED ORDER — LACTATED RINGERS IV SOLN
INTRAVENOUS | Status: DC
  Administered 2019-09-10: 17:00:00 via INTRAVENOUS

## 2019-09-10 MED ORDER — METOCLOPRAMIDE HCL 10 MG PO TABS: 10.0000 mg | ORAL_TABLET | Freq: Four times a day (QID) | ORAL | 0 refills | Status: DC | PRN

## 2019-09-10 MED ORDER — METOCLOPRAMIDE HCL 10 MG PO TABS: 10.0000 mg | ORAL_TABLET | Freq: Three times a day (TID) | ORAL | 0 refills | Status: DC

## 2019-09-10 MED ORDER — FAMOTIDINE IN NACL 20-0.9 MG/50ML-% IV SOLN
20.0000 mg | Freq: Once | INTRAVENOUS | Status: AC
  Administered 2019-09-10: 20 mg via INTRAVENOUS
  Filled 2019-09-10: qty 50

## 2019-09-10 NOTE — MAU Note (Signed)
Pt sent up from ER.  Arrived by EMS  C/o lower abd pain. .  Reported + preg test at [facility] yesterday.

## 2019-09-10 NOTE — ED Notes (Signed)
Pt just came to sort desk to inform me of pain and also added she was at [facility] yesterday and had a positive pregnancy test. Pt states her LMP was on NOV 1st- she denies any bleeding just 10/10 left LLQ pain. MAU called and report given agreeable this patient should come there.

## 2019-09-10 NOTE — MAU Note (Signed)
Started having pain in abd on Sat. Worse when sitting down.  Pain in LUQ and LLQ.  Vomiting started on Sat too.  Unable to keep anything down. Was given 2 bags of IV fluids at [facility] yesterday, told her she was preg, pt was unaware.  Keeps going hot and cold, back and forth. Also having diarrhea, started Saturday also.

## 2019-09-10 NOTE — Discharge Instructions (Signed)
Cannabinoid Hyperemesis Syndrome Cannabinoid hyperemesis syndrome (CHS) is a condition that causes repeated nausea, vomiting, and abdominal pain after long-term (chronic) use of marijuana (cannabis). People with CHS typically use marijuana 3-5 times a day for many years before they have symptoms, although it is possible to develop CHS with as little as 1 use per day. Symptoms of CHS may be mild at first but can get worse and more frequent. In some cases, CHS may cause vomiting many times a day, which can lead to weight loss and dehydration. CHS may go away and come back many times (recur). People may not have symptoms or may otherwise be healthy in between [facility] attacks. What are the causes? The exact cause of this condition is not known. Long-term use of marijuana may over-stimulate certain proteins in the brain that react with chemicals in marijuana (cannabinoid receptors). This over-stimulation may cause CHS. What are the signs or symptoms? Symptoms of this condition are often mild during the first few attacks, but they can get worse over time. Symptoms may include:  Frequent nausea, especially early in the morning.  Vomiting.  Abdominal pain. Taking several hot showers throughout the day can also be a sign of this condition. People with CHS may do this because it relieves symptoms. How is this diagnosed? This condition may be diagnosed based on:  Your symptoms and medical history, including any drug use.  A physical exam. You may have tests done to rule out other problems. These tests may include:  Blood tests.  Urine tests.  Imaging tests, such as an X-ray or CT scan. How is this treated? Treatment for this condition involves stopping marijuana use. Your health care provider may recommend:  A drug rehabilitation program, if you have trouble stopping marijuana use.  Medicines for nausea.  Hot showers to help relieve symptoms. Certain creams that contain a substance called  capsaicin may improve symptoms when applied to the abdomen. Ask your health care provider before starting any medicines or other treatments. Severe nausea and vomiting may require you to stay at the hospital. You may need IV fluids to prevent or treat dehydration. You may also need certain medicines that must be given at the hospital. Follow these instructions at home: During an attack   Stay in bed and rest in a dark, quiet room.  Take anti-nausea medicine as told by your health care provider.  Try taking hot showers to relieve your symptoms. After an attack  Drink small amounts of clear fluids slowly. Gradually add more.  Once you are able to eat without vomiting, eat soft foods in small amounts every 3-4 hours. General instructions   Do not use any products that contain marijuana.If you need help quitting, ask your health care provider for resources and treatment options.  Drink enough fluid to keep your urine pale yellow. Avoid drinking fluids that have a lot of sugar or caffeine, such as coffee and soda.  Take and apply over-the-counter and prescription medicines only as told by your health care provider. Ask your health care provider before starting any new medicines or treatments.  Keep all follow-up visits as told by your health care provider. This is important. Contact a health care provider if:  Your symptoms get worse.  You cannot drink fluids without vomiting.  You have pain and trouble swallowing after an attack. Get help right away if:  You cannot stop vomiting.  You have blood in your vomit or your vomit looks like coffee grounds.  You  have severe abdominal pain.  You have stools that are bloody or black, or stools that look like tar.  You have symptoms of dehydration, such as: ? Sunken eyes. ? Inability to make tears. ? Cracked lips. ? Dry mouth. ? Decreased urine production. ? Weakness. ? Sleepiness. ? Fainting. Summary  Cannabinoid hyperemesis  syndrome (CHS) is a condition that causes repeated nausea, vomiting, and abdominal pain after long-term use of marijuana.  People with CHS typically use marijuana 3-5 times a day for many years before they have symptoms, although it is possible to develop CHS with as little as 1 use per day.  Treatment for this condition involves stopping marijuana use. Hot showers and capsaicin creams may also help relieve symptoms. Ask your health care provider before starting any medicines or other treatments.  Your health care provider may prescribe medicines to help with nausea.  Get help right away if you have signs of dehydration, such as dry mouth, decreased urine production, or weakness. This information is not intended to replace advice given to you by your health care provider. Make sure you discuss any questions you have with your health care provider. Document Released: 01/11/2017 Document Revised: 02/09/2018 Document Reviewed: 01/11/2017 Elsevier Patient Education  2020 [employer]   Abdominal Pain During Pregnancy  Abdominal pain is common during pregnancy, and has many possible causes. Some causes are more serious than others, and sometimes the cause is not known. Abdominal pain can be a sign that labor is starting. It can also be caused by normal growth and stretching of muscles and ligaments during pregnancy. Always tell your health care provider if you have any abdominal pain. Follow these instructions at home:  Do not have sex or put anything in your vagina until your pain goes away completely.  Get plenty of rest until your pain improves.  Drink enough fluid to keep your urine pale yellow.  Take over-the-counter and prescription medicines only as told by your health care provider.  Keep all follow-up visits as told by your health care provider. This is important. Contact a health care provider if:  Your pain continues or gets worse after resting.  You have lower abdominal pain  that: ? Comes and goes at regular intervals. ? Spreads to your back. ? Is similar to menstrual cramps.  You have pain or burning when you urinate. Get help right away if:  You have a fever or chills.  You have vaginal bleeding.  You are leaking fluid from your vagina.  You are passing tissue from your vagina.  You have vomiting or diarrhea that lasts for more than 24 hours.  Your baby is moving less than usual.  You feel very weak or faint.  You have shortness of breath.  You develop severe pain in your upper abdomen. Summary  Abdominal pain is common during pregnancy, and has many possible causes.  If you experience abdominal pain during pregnancy, tell your health care provider right away.  Follow your health care provider's home care instructions and keep all follow-up visits as directed. This information is not intended to replace advice given to you by your health care provider. Make sure you discuss any questions you have with your health care provider. Document Released: 10/03/2005 Document Revised: 01/21/2019 Document Reviewed: 01/05/2017 Elsevier Patient Education  2020 [employer]   Ectopic Pregnancy  An ectopic pregnancy is when the fertilized egg attaches (implants) outside the uterus. Most ectopic pregnancies occur in one of the tubes where eggs travel  from the ovary to the uterus (fallopian tubes), but the implanting can occur in other locations. In rare cases, ectopic pregnancies occur on the ovary, intestine, pelvis, abdomen, or cervix. In an ectopic pregnancy, the fertilized egg does not have the ability to develop into a normal, healthy baby. A ruptured ectopic pregnancy is one in which tearing or bursting of a fallopian tube causes internal bleeding. Often, there is intense lower abdominal pain, and vaginal bleeding sometimes occurs. Having an ectopic pregnancy can be life-threatening. If this dangerous condition is not treated, it can lead to blood loss,  shock, or even death. What are the causes? The most common cause of this condition is damage to one of the fallopian tubes. A fallopian tube may be narrowed or blocked, and that keeps the fertilized egg from reaching the uterus. What increases the risk? This condition is more likely to develop in women of childbearing age who have different levels of risk. The levels of risk can be divided into three categories. High risk  You have gone through infertility treatment.  You have had an ectopic pregnancy before.  You have had surgery on the fallopian tubes, or another surgical procedure, such as an abortion.  You have had surgery to have the fallopian tubes tied (tubal ligation).  You have problems or diseases of the fallopian tubes.  You have been exposed to diethylstilbestrol (DES). This medicine was used until 1971, and it had effects on babies whose mothers took the medicine.  You become pregnant while using an IUD (intrauterine device) for birth control. Moderate risk  You have a history of infertility.  You have had an STI (sexually transmitted infection).  You have a history of pelvic inflammatory disease (PID).  You have scarring from endometriosis.  You have multiple sexual partners.  You smoke. Low risk  You have had pelvic surgery.  You use vaginal douches.  You became sexually active before age 45. What are the signs or symptoms? Common symptoms of this condition include normal pregnancy symptoms, such as missing a period, nausea, tiredness, abdominal pain, breast tenderness, and bleeding. However, ectopic pregnancy will have additional symptoms, such as:  Pain with intercourse.  Irregular vaginal bleeding or spotting.  Cramping or pain on one side or in the lower abdomen.  Fast heartbeat, low blood pressure, and sweating.  Passing out while having a bowel movement. Symptoms of a ruptured ectopic pregnancy and internal bleeding may include:  Sudden,  severe pain in the abdomen and pelvis.  Dizziness, weakness, light-headedness, or fainting.  Pain in the shoulder or neck area. How is this diagnosed? This condition is diagnosed by:  A pelvic exam to locate pain or a mass in the abdomen.  A pregnancy test. This blood test checks for the presence as well as the specific level of pregnancy hormone in the bloodstream.  Ultrasound. This is performed if a pregnancy test is positive. In this test, a probe is inserted into the vagina. The probe will detect a fetus, possibly in a location other than the uterus.  Taking a sample of uterus tissue (dilation and curettage, or D&C).  Surgery to perform a visual exam of the inside of the abdomen using a thin, lighted tube that has a tiny camera on the end (laparoscope).  Culdocentesis. This procedure involves inserting a needle at the top of the vagina, behind the uterus. If blood is present in this area, it may indicate that a fallopian tube is torn. How is this treated? This condition  is treated with medicine or surgery. Medicine  An injection of a medicine (methotrexate) may be given to cause the pregnancy tissue to be absorbed. This medicine may save your fallopian tube. It may be given if: ? The diagnosis is made early, with no signs of active bleeding. ? The fallopian tube has not ruptured. ? You are considered to be a good candidate for the medicine. Usually, pregnancy hormone blood levels are checked after methotrexate treatment. This is to be sure that the medicine is effective. It may take 4-6 weeks for the pregnancy to be absorbed. Most pregnancies will be absorbed by 3 weeks. Surgery  A laparoscope may be used to remove the pregnancy tissue.  If severe internal bleeding occurs, a larger cut (incision) may be made in the lower abdomen (laparotomy) to remove the fetus and placenta. This is done to stop the bleeding.  Part or all of the fallopian tube may be removed (salpingectomy)  along with the fetus and placenta. The fallopian tube may also be repaired during the surgery.  In very rare circumstances, removal of the uterus (hysterectomy) may be required.  After surgery, pregnancy hormone testing may be done to be sure that there is no pregnancy tissue left. Whether your treatment is medicine or surgery, you may receive a Rho (D) immune globulin shot to prevent problems with any future pregnancy. This shot may be given if:  You are Rh-negative and the baby's father is Rh-positive.  You are Rh-negative and you do not know the Rh type of the baby's father. Follow these instructions at home:  Rest and limit your activity after the procedure for as long as told by your health care provider.  Until your health care provider says that it is safe: ? Do not lift anything that is heavier than 10 lb (4.5 kg), or the limit that your health care provider tells you. ? Avoid physical exercise and any movement that requires effort (is strenuous).  To help prevent constipation: ? Eat a healthy diet that includes fruits, vegetables, and whole grains. ? Drink 6-8 glasses of water per day. Get help right away if:  You develop worsening pain that is not relieved by medicine.  You have: ? A fever or chills. ? Vaginal bleeding. ? Redness and swelling at the incision site. ? Nausea and vomiting.  You feel dizzy or weak.  You feel light-headed or you faint. This information is not intended to replace advice given to you by your health care provider. Make sure you discuss any questions you have with your health care provider. Document Released: 11/10/2004 Document Revised: 09/15/2017 Document Reviewed: 05/04/2016 Elsevier Patient Education  2020 [employer].  Hyperemesis Gravidarum Hyperemesis gravidarum is a severe form of nausea and vomiting that happens during pregnancy. Hyperemesis is worse than morning sickness. It may cause you to have nausea or vomiting all day for many  days. It may keep you from eating and drinking enough food and liquids, which can lead to dehydration, malnutrition, and weight loss. Hyperemesis usually occurs during the first half (the first 20 weeks) of pregnancy. It often goes away once a woman is in her second half of pregnancy. However, sometimes hyperemesis continues through an entire pregnancy. What are the causes? The cause of this condition is not known. It may be related to changes in chemicals (hormones) in the body during pregnancy, such as the high level of pregnancy hormone (human chorionic gonadotropin) or the increase in the female sex hormone (estrogen). What are  the signs or symptoms? Symptoms of this condition include:  Nausea that does not go away.  Vomiting that does not allow you to keep any food down.  Weight loss.  Body fluid loss (dehydration).  Having no desire to eat, or not liking food that you have previously enjoyed. How is this diagnosed? This condition may be diagnosed based on:  A physical exam.  Your medical history.  Your symptoms.  Blood tests.  Urine tests. How is this treated? This condition is managed by controlling symptoms. This may include:  Following an eating plan. This can help lessen nausea and vomiting.  Taking prescription medicines. An eating plan and medicines are often used together to help control symptoms. If medicines do not help relieve nausea and vomiting, you may need to receive fluids through an IV at the hospital. Follow these instructions at home: Eating and drinking   Avoid the following: ? Drinking fluids with meals. Try not to drink anything during the 30 minutes before and after your meals. ? Drinking more than 1 cup of fluid at a time. ? Eating foods that trigger your symptoms. These may include spicy foods, coffee, high-fat foods, very sweet foods, and acidic foods. ? Skipping meals. Nausea can be more intense on an empty stomach. If you cannot tolerate  food, do not force it. Try sucking on ice chips or other frozen items and make up for missed calories later. ? Lying down within 2 hours after eating. ? Being exposed to environmental triggers. These may include food smells, smoky rooms, closed spaces, rooms with strong smells, warm or humid places, overly loud and noisy rooms, and rooms with motion or flickering lights. Try eating meals in a well-ventilated area that is free of strong smells. ? Quick and sudden changes in your movement. ? Taking iron pills and multivitamins that contain iron. If you take prescription iron pills, do not stop taking them unless your health care provider approves. ? Preparing food. The smell of food can spoil your appetite or trigger nausea.  To help relieve your symptoms: ? Listen to your body. Everyone is different and has different preferences. Find what works best for you. ? Eat and drink slowly. ? Eat 5-6 small meals daily instead of 3 large meals. Eating small meals and snacks can help you avoid an empty stomach. ? In the morning, before getting out of bed, eat a couple of crackers to avoid moving around on an empty stomach. ? Try eating starchy foods as these are usually tolerated well. Examples include cereal, toast, bread, potatoes, pasta, rice, and pretzels. ? Include at least 1 serving of protein with your meals and snacks. Protein options include lean meats, poultry, seafood, beans, nuts, nut butters, eggs, cheese, and yogurt. ? Try eating a protein-rich snack before bed. Examples of a protein-rick snack include cheese and crackers or a peanut butter sandwich made with 1 slice of whole-wheat bread and 1 tsp (5 g) of peanut butter. ? Eat or suck on things that have ginger in them. It may help relieve nausea. Add  tsp ground ginger to hot tea or choose ginger tea. ? Try drinking 100% fruit juice or an electrolyte drink. An electrolyte drink contains sodium, potassium, and chloride. ? Drink fluids that are  cold, clear, and carbonated or sour. Examples include lemonade, ginger ale, lemon-lime soda, ice water, and sparkling water. ? Brush your teeth or use a mouth rinse after meals. ? Talk with your health care provider about starting a  supplement of vitamin B6. General instructions  Take over-the-counter and prescription medicines only as told by your health care provider.  Follow instructions from your health care provider about eating or drinking restrictions.  Continue to take your prenatal vitamins as told by your health care provider. If you are having trouble taking your prenatal vitamins, talk with your health care provider about different options.  Keep all follow-up and pre-birth (prenatal) visits as told by your health care provider. This is important. Contact a health care provider if:  You have pain in your abdomen.  You have a severe headache.  You have vision problems.  You are losing weight.  You feel weak or dizzy. Get help right away if:  You cannot drink fluids without vomiting.  You vomit blood.  You have constant nausea and vomiting.  You are very weak.  You faint.  You have a fever and your symptoms suddenly get worse. Summary  Hyperemesis gravidarum is a severe form of nausea and vomiting that happens during pregnancy.  Making some changes to your eating habits may help relieve nausea and vomiting.  This condition may be managed with medicine.  If medicines do not help relieve nausea and vomiting, you may need to receive fluids through an IV at the hospital. This information is not intended to replace advice given to you by your health care provider. Make sure you discuss any questions you have with your health care provider. Document Released: 10/03/2005 Document Revised: 10/23/2017 Document Reviewed: 06/01/2016 Elsevier Patient Education  2020 [employer] Inc[employer]  Morning Sickness  Morning sickness is when a woman feels nauseous during pregnancy.  This nauseous feeling may or may not come with vomiting. It often occurs in the morning, but it can be a problem at any time of day. Morning sickness is most common during the first trimester. In some cases, it may continue throughout pregnancy. Although morning sickness is unpleasant, it is usually harmless unless the woman develops severe and continual vomiting (hyperemesis gravidarum), a condition that requires more intense treatment. What are the causes? The exact cause of this condition is not known, but it seems to be related to normal hormonal changes that occur in pregnancy. What increases the risk? You are more likely to develop this condition if:  You experienced nausea or vomiting before your pregnancy.  You had morning sickness during a previous pregnancy.  You are pregnant with more than one baby, such as twins. What are the signs or symptoms? Symptoms of this condition include:  Nausea.  Vomiting. How is this diagnosed? This condition is usually diagnosed based on your signs and symptoms. How is this treated? In many cases, treatment is not needed for this condition. Making some changes to what you eat may help to control symptoms. Your health care provider may also prescribe or recommend:  Vitamin B6 supplements.  Anti-nausea medicines.  Ginger. Follow these instructions at home: Medicines  Take over-the-counter and prescription medicines only as told by your health care provider. Do not use any prescription, over-the-counter, or herbal medicines for morning sickness without first talking with your health care provider.  Taking multivitamins before getting pregnant can prevent or decrease the severity of morning sickness in most women. Eating and drinking  Eat a piece of dry toast or crackers before getting out of bed in the morning.  Eat 5 or 6 small meals a day.  Eat dry and bland foods, such as rice or a baked potato. Foods that are high in carbohydrates  are  often helpful.  Avoid greasy, fatty, and spicy foods.  Have someone cook for you if the smell of any food causes nausea and vomiting.  If you feel nauseous after taking prenatal vitamins, take the vitamins at night or with a snack.  Snack on protein foods between meals if you are hungry. Nuts, yogurt, and cheese are good options.  Drink fluids throughout the day.  Try ginger ale made with real ginger, ginger tea made from fresh grated ginger, or ginger candies. General instructions  Do not use any products that contain nicotine or tobacco, such as cigarettes and e-cigarettes. If you need help quitting, ask your health care provider.  Get an air purifier to keep the air in your house free of odors.  Get plenty of fresh air.  Try to avoid odors that trigger your nausea.  Consider trying these methods to help relieve symptoms: ? Wearing an acupressure wristband. These wristbands are often worn for seasickness. ? Acupuncture. Contact a health care provider if:  Your home remedies are not working and you need medicine.  You feel dizzy or light-headed.  You are losing weight. Get help right away if:  You have persistent and uncontrolled nausea and vomiting.  You faint.  You have severe pain in your abdomen. Summary  Morning sickness is when a woman feels nauseous during pregnancy. This nauseous feeling may or may not come with vomiting.  Morning sickness is most common during the first trimester.  It often occurs in the morning, but it can be a problem at any time of day.  In many cases, treatment is not needed for this condition. Making some changes to what you eat may help to control symptoms. This information is not intended to replace advice given to you by your health care provider. Make sure you discuss any questions you have with your health care provider. Document Released: 11/24/2006 Document Revised: 09/15/2017 Document Reviewed: 11/05/2016 Elsevier Patient  Education  2020 [employer].                  Safe Medications in Pregnancy    Acne: Benzoyl Peroxide Salicylic Acid  Backache/Headache: Tylenol: 2 regular strength every 4 hours OR              2 Extra strength every 6 hours  Colds/Coughs/Allergies: Benadryl (alcohol free) 25 mg every 6 hours as needed Breath right strips Claritin Cepacol throat lozenges Chloraseptic throat spray Cold-Eeze- up to three times per day Cough drops, alcohol free Flonase (by prescription only) Guaifenesin Mucinex Robitussin DM (plain only, alcohol free) Saline nasal spray/drops Sudafed (pseudoephedrine) & Actifed ** use only after [redacted] weeks gestation and if you do not have high blood pressure Tylenol Vicks Vaporub Zinc lozenges Zyrtec   Constipation: Colace Ducolax suppositories Fleet enema Glycerin suppositories Metamucil Milk of magnesia Miralax Senokot Smooth move tea  Diarrhea: Kaopectate Imodium A-D  *NO pepto Bismol  Hemorrhoids: Anusol Anusol HC Preparation H Tucks  Indigestion: Tums Maalox Mylanta Zantac  Pepcid  Insomnia: Benadryl (alcohol free)  every 6 hours as needed Tylenol PM Unisom, no Gelcaps  Leg Cramps: Tums MagGel  Nausea/Vomiting:  Bonine Dramamine Emetrol Ginger extract Sea bands Meclizine  Nausea medication to take during pregnancy:  Unisom (doxylamine succinate 25 mg tablets) Take one tablet daily at bedtime. If symptoms are not adequately controlled, the dose can be increased to a maximum recommended dose of two tablets daily (1/2 tablet in the morning, 1/2 tablet mid-afternoon and one at bedtime).  Vitamin B6  tablets. Take one tablet twice a day (up to 200 mg per day).  Skin Rashes: Aveeno products Benadryl cream or  every 6 hours as needed Calamine Lotion 1% cortisone cream  Yeast infection: Gyne-lotrimin 7 Monistat 7   **If taking multiple medications, please check labels to avoid duplicating the same  active ingredients **take medication as directed on the label ** Do not exceed 4000 mg of tylenol in 24 hours **Do not take medications that contain aspirin or ibuprofen

## 2019-09-10 NOTE — MAU Provider Note (Signed)
History     CSN: [id number]  Arrival date and time: 09/10/19 1330   First Provider Initiated Contact with Patient 09/10/19 1505      Chief Complaint  Patient presents with  . Abdominal Pain  . Emesis   Ms. Marilyn Richardson is a 23 y.o. G1P0 at Unknown who presents to MAU for cannabis hyperemesis. Pt admits to smoking marijuana and states that she has had hyperemesis induced by marijuana use prior to today, and she originally presented to the ED believing that she was again experiencing cannabis hyperemesis. Pt reports she went to a hospital in [place] yesterday where they only gave her fluids and Reglan. Pt reports hot showers have typically helped her when she feels this was, but not this time. However, of note, patient is requesting to take a shower in MAU because she states "I think it will make me feel better." Also of note, patient presented with extremely similar symptoms to the ED in 04/2019, with resolution of symptoms during ED stay.  Onset: Saturday Location: stomach Duration: ~4days Character: constant nausea, vomiting x6-8 in past 24hrs Aggravating/Associated: none/stomach pain - upper and lower abdomen Relieving: none Treatment: none Severity: 6/10 for upper abdomen, 7/10 for lower abdomen  Pt denies VB, LOF, ctx, vaginal discharge/odor/itching. Pt denies constipation, diarrhea, or urinary problems. Pt denies fever, chills, fatigue, sweating or changes in appetite. Pt denies SOB or chest pain. Pt denies dizziness, HA, light-headedness, weakness.  Problems this pregnancy include: pt has not yet been seen this pregnancy. Allergies? plastic Current medications/supplements? none since Saturday (was taking Adderall, Protonix, spironolactone) Prenatal care provider? Plans to see Dr. Elon Spanner   OB History    Gravida  1   Para      Term      Preterm      AB      Living        SAB      TAB      Ectopic      Multiple      Live Births               Past Medical History:  Diagnosis Date  . Depression    doing good  . Endometriosis   . Infection    UTI  . Medical history non-contributory   . PCOS (polycystic ovarian syndrome)     Past Surgical History:  Procedure Laterality Date  . NO PAST SURGERIES      Family History  Problem Relation Age of Onset  . Factor V Leiden deficiency Mother   . Factor V Leiden deficiency Paternal Grandmother   . Factor V Leiden deficiency Maternal Aunt   . Depression Father     Social History   Tobacco Use  . Smoking status: Former Smoker    Packs/day: 1.00    Years: 5.00    Pack years: 5.00  . Smokeless tobacco: Never Used  . Tobacco comment: dec 2019  Substance Use Topics  . Alcohol use: Not Currently  . Drug use: Yes    Types: Marijuana    Comment: daily    Allergies:  Allergies  Allergen Reactions  . Other Hives    Plastic     Medications Prior to Admission  Medication Sig Dispense Refill Last Dose  . amphetamine-dextroamphetamine (ADDERALL) 15 MG tablet Take 15 mg by mouth daily.   Past Week at Unknown time  . pantoprazole (PROTONIX) 20 MG tablet Take 1 tablet (20 mg total) by mouth daily. 30 tablet  0 09/10/2019 at Unknown time  . [DISCONTINUED] metoCLOPramide (REGLAN) 10 MG tablet Take 1 tablet (10 mg total) by mouth every 6 (six) hours as needed for nausea (nausea/headache). 30 tablet 0 09/10/2019 at Unknown time  . escitalopram (LEXAPRO) 20 MG tablet Take 1 tablet (20 mg total) by mouth daily. (Patient not taking: Reported on 11/22/2017) 30 tablet 0   . nicotine polacrilex (NICORETTE) 2 MG gum Take 1 each (2 mg total) by mouth as needed for smoking cessation. (Patient not taking: Reported on 11/22/2017) 100 tablet 0   . ondansetron (ZOFRAN-ODT) 4 MG disintegrating tablet Take 4 mg by mouth daily as needed for nausea.    Unknown at Unknown time  . pantoprazole (PROTONIX) 40 MG tablet Take 1 tablet (40 mg total) by mouth daily. (Patient not taking: Reported on 11/22/2017)  30 tablet 0   . promethazine (PHENERGAN) 25 MG tablet Take 25 mg by mouth daily as needed for nausea.    Unknown at Unknown time  . zolpidem (AMBIEN) 5 MG tablet Take 5 mg by mouth daily as needed.   Unknown at Unknown time    Review of Systems  Constitutional: Negative for chills, diaphoresis, fatigue and fever.  Eyes: Negative for visual disturbance.  Respiratory: Negative for shortness of breath.   Cardiovascular: Negative for chest pain.  Gastrointestinal: Positive for abdominal pain, nausea and vomiting. Negative for constipation and diarrhea.  Genitourinary: Negative for dysuria, flank pain, frequency, pelvic pain, urgency, vaginal bleeding and vaginal discharge.  Neurological: Negative for dizziness, weakness, light-headedness and headaches.   Physical Exam   Blood pressure (!) 126/55, pulse (!) 49, temperature 98.3 F (36.8 C), temperature source Oral, resp. rate 18, height 5\' 5"  (1.651 m), weight 70.5 kg, SpO2 99 %.  Patient Vitals for the past 24 hrs:  BP Temp Temp src Pulse Resp SpO2 Height Weight  09/10/19 2018 (!) 126/55 98.3 F (36.8 C) Oral (!) 49 18 - - -  09/10/19 1438 136/77 97.6 F (36.4 C) Oral 76 20 99 % 5\' 5"  (1.651 m) 70.5 kg  09/10/19 1332 128/86 (!) 97.5 F (36.4 C) Oral 83 (!) 24 100 % - -   Physical Exam  Constitutional: She is oriented to person, place, and time. She appears well-developed and well-nourished. No distress.  HENT:  Head: Normocephalic and atraumatic.  Respiratory: Effort normal.  GI: Soft. She exhibits no distension and no mass. There is abdominal tenderness in the left upper quadrant. There is no rebound and no guarding.  Neurological: She is alert and oriented to person, place, and time.  Skin: Skin is warm and dry. She is not diaphoretic.  Psychiatric: She has a normal mood and affect. Her behavior is normal. Judgment and thought content normal.   Results for orders placed or performed during the hospital encounter of 09/10/19 (from  the past 24 hour(s))  Lipase, blood     Status: None   Collection Time: 09/10/19  1:51 PM  Result Value Ref Range   Lipase 39 11 - 51 U/L  Comprehensive metabolic panel     Status: Abnormal   Collection Time: 09/10/19  1:51 PM  Result Value Ref Range   Sodium 137 135 - 145 mmol/L   Potassium 3.5 3.5 - 5.1 mmol/L   Chloride 105 98 - 111 mmol/L   CO2 19 (L) 22 - 32 mmol/L   Glucose, Bld 114 (H) 70 - 99 mg/dL   BUN 7 6 - 20 mg/dL   Creatinine, Ser 7.820.99 0.44 -  1.00 mg/dL   Calcium 9.2 8.9 - 16.1 mg/dL   Total Protein 7.2 6.5 - 8.1 g/dL   Albumin 4.3 3.5 - 5.0 g/dL   AST 25 15 - 41 U/L   ALT 19 0 - 44 U/L   Alkaline Phosphatase 54 38 - 126 U/L   Total Bilirubin 0.8 0.3 - 1.2 mg/dL   GFR calc non Af Amer >60 >60 mL/min   GFR calc Af Amer >60 >60 mL/min   Anion gap 13 5 - 15  CBC     Status: Abnormal   Collection Time: 09/10/19  1:51 PM  Result Value Ref Range   WBC 13.7 (H) 4.0 - 10.5 K/uL   RBC 4.09 3.87 - 5.11 MIL/uL   Hemoglobin 13.4 12.0 - 15.0 g/dL   HCT 09.6 04.5 - 40.9 %   MCV 97.3 80.0 - 100.0 fL   MCH 32.8 26.0 - 34.0 pg   MCHC 33.7 30.0 - 36.0 g/dL   RDW 81.1 91.4 - 78.2 %   Platelets 230 150 - 400 K/uL   nRBC 0.0 0.0 - 0.2 %  I-Stat beta hCG blood, ED     Status: Abnormal   Collection Time: 09/10/19  1:59 PM  Result Value Ref Range   I-stat hCG, quantitative 43.7 (H) <5 mIU/mL   Comment 3          Urinalysis, Routine w reflex microscopic     Status: Abnormal   Collection Time: 09/10/19  2:50 PM  Result Value Ref Range   Color, Urine YELLOW YELLOW   APPearance CLEAR CLEAR   Specific Gravity, Urine 1.020 1.005 - 1.030   pH 7.0 5.0 - 8.0   Glucose, UA NEGATIVE NEGATIVE mg/dL   Hgb urine dipstick NEGATIVE NEGATIVE   Bilirubin Urine NEGATIVE NEGATIVE   Ketones, ur 80 (A) NEGATIVE mg/dL   Protein, ur NEGATIVE NEGATIVE mg/dL   Nitrite NEGATIVE NEGATIVE   Leukocytes,Ua NEGATIVE NEGATIVE  Urine rapid drug screen (hosp performed)     Status: Abnormal   Collection  Time: 09/10/19  3:01 PM  Result Value Ref Range   Opiates NONE DETECTED NONE DETECTED   Cocaine NONE DETECTED NONE DETECTED   Benzodiazepines NONE DETECTED NONE DETECTED   Amphetamines NONE DETECTED NONE DETECTED   Tetrahydrocannabinol POSITIVE (A) NONE DETECTED   Barbiturates NONE DETECTED NONE DETECTED  hCG, quantitative, pregnancy     Status: Abnormal   Collection Time: 09/10/19  3:26 PM  Result Value Ref Range   hCG, Beta Chain, Quant, S 51 (H) <5 mIU/mL  ABO/Rh     Status: None   Collection Time: 09/10/19  3:26 PM  Result Value Ref Range   ABO/RH(D) AB POS    No rh immune globuloin      NOT A RH IMMUNE GLOBULIN CANDIDATE, PT RH POSITIVE Performed at [facility], [address], [place], [place] [address]   CBC with Differential/Platelet     Status: Abnormal   Collection Time: 09/10/19  3:26 PM  Result Value Ref Range   WBC 13.6 (H) 4.0 - 10.5 K/uL   RBC 3.92 3.87 - 5.11 MIL/uL   Hemoglobin 13.0 12.0 - 15.0 g/dL   HCT 30.8 65.7 - 84.6 %   MCV 94.1 80.0 - 100.0 fL   MCH 33.2 26.0 - 34.0 pg   MCHC 35.2 30.0 - 36.0 g/dL   RDW 96.2 95.2 - 84.1 %   Platelets 230 150 - 400 K/uL   nRBC 0.0 0.0 - 0.2 %  Neutrophils Relative % 84 %   Neutro Abs 11.5 (H) 1.7 - 7.7 K/uL   Lymphocytes Relative 9 %   Lymphs Abs 1.2 0.7 - 4.0 K/uL   Monocytes Relative 6 %   Monocytes Absolute 0.8 0.1 - 1.0 K/uL   Eosinophils Relative 0 %   Eosinophils Absolute 0.0 0.0 - 0.5 K/uL   Basophils Relative 0 %   Basophils Absolute 0.0 0.0 - 0.1 K/uL   Immature Granulocytes 1 %   Abs Immature Granulocytes 0.08 (H) 0.00 - 0.07 K/uL  Wet prep, genital     Status: Abnormal   Collection Time: 09/10/19  8:23 PM  Result Value Ref Range   Yeast Wet Prep HPF POC PRESENT (A) NONE SEEN   Trich, Wet Prep NONE SEEN NONE SEEN   Clue Cells Wet Prep HPF POC NONE SEEN NONE SEEN   WBC, Wet Prep HPF POC FEW (A) NONE SEEN   Sperm NONE SEEN    US Abdomen Complete  Addendum Date: 09/10/2019   ADDENDUM  REPORT: 09/10/2019 20:04 CLINICAL DATA:  Left upper quadrant abdominal pain and tenderness. EXAM: ULTRASOUND ABDOMEN COMPLETE COMPARISON:  None. FINDINGS: Gallbladder: No gallstones or wall thickening visualized. No sonographic Murphy sign noted. A mural soft tissue polyp is seen along the nondependent wall near the fundus which measures 8 mm. Common bile duct: Diameter: 3 mm, within normal limits. Liver: No focal lesion identified. Within normal limits in parenchymal echogenicity. IVC: No abnormality visualized. Pancreas: Visualized portion unremarkable. Spleen: Size and appearance within normal limits. Right Kidney: Length: 10.8 cm. Echogenicity within normal limits. No mass or hydronephrosis visualized. Left Kidney: Length: 11.7 cm. Echogenicity within normal limits. No mass or hydronephrosis visualized. Abdominal aorta: No aneurysm visualized. Other findings: None. IMPRESSION: No acute findings. 8 mm gallbladder polyp. Continued follow-up of this finding is recommended by ultrasound in 1 year. This recommendation follows ACR consensus guidelines: White Paper of the ACR Incidental Findings Committee II on Gallbladder and Biliary Findings. J Am Coll Radiol 2013:;10:953-956. Electronically Signed   By: Danae Orleans M.D.   On: 09/10/2019 20:04   Result Date: 09/10/2019 CLINICAL DATA:  Abdominal pain in 1st trimester pregnancy. Unknown LMP. EXAM: OBSTETRIC <14 WK [place] AND TRANSVAGINAL OB US TECHNIQUE: Both transabdominal and transvaginal ultrasound examinations were performed for complete evaluation of the gestation as well as the maternal uterus, adnexal regions, and pelvic cul-de-sac. Transvaginal technique was performed to assess early pregnancy. COMPARISON:  None. FINDINGS: Intrauterine gestational sac: None Maternal uterus/adnexae: Both ovaries are normal in appearance. No masses identified. Small amount of simple free fluid noted in pelvic cul-de-sac. IMPRESSION: Pregnancy of unknown anatomic location (no  intrauterine gestational sac or adnexal mass identified). Differential diagnosis includes recent spontaneous abortion, IUP too early to visualize, and non-visualized ectopic pregnancy. Recommend correlation with serial beta-hCG levels, and follow up US if warranted clinically. Electronically Signed: By: Danae Orleans M.D. On: 09/10/2019 19:39   US Ob Less Than 14 Weeks With Ob Transvaginal  Addendum Date: 09/10/2019   ADDENDUM REPORT: 09/10/2019 20:04 CLINICAL DATA:  Left upper quadrant abdominal pain and tenderness. EXAM: ULTRASOUND ABDOMEN COMPLETE COMPARISON:  None. FINDINGS: Gallbladder: No gallstones or wall thickening visualized. No sonographic Murphy sign noted. A mural soft tissue polyp is seen along the nondependent wall near the fundus which measures 8 mm. Common bile duct: Diameter: 3 mm, within normal limits. Liver: No focal lesion identified. Within normal limits in parenchymal echogenicity. IVC: No abnormality visualized. Pancreas: Visualized portion unremarkable. Spleen: Size and appearance  within normal limits. Right Kidney: Length: 10.8 cm. Echogenicity within normal limits. No mass or hydronephrosis visualized. Left Kidney: Length: 11.7 cm. Echogenicity within normal limits. No mass or hydronephrosis visualized. Abdominal aorta: No aneurysm visualized. Other findings: None. IMPRESSION: No acute findings. 8 mm gallbladder polyp. Continued follow-up of this finding is recommended by ultrasound in 1 year. This recommendation follows ACR consensus guidelines: White Paper of the ACR Incidental Findings Committee II on Gallbladder and Biliary Findings. J Am Coll Radiol 2013:;10:953-956. Electronically Signed   By: Marlaine Hind M.D.   On: 09/10/2019 20:04   Result Date: 09/10/2019 CLINICAL DATA:  Abdominal pain in 1st trimester pregnancy. Unknown LMP. EXAM: OBSTETRIC <14 WK [place] AND TRANSVAGINAL OB US TECHNIQUE: Both transabdominal and transvaginal ultrasound examinations were performed for complete  evaluation of the gestation as well as the maternal uterus, adnexal regions, and pelvic cul-de-sac. Transvaginal technique was performed to assess early pregnancy. COMPARISON:  None. FINDINGS: Intrauterine gestational sac: None Maternal uterus/adnexae: Both ovaries are normal in appearance. No masses identified. Small amount of simple free fluid noted in pelvic cul-de-sac. IMPRESSION: Pregnancy of unknown anatomic location (no intrauterine gestational sac or adnexal mass identified). Differential diagnosis includes recent spontaneous abortion, IUP too early to visualize, and non-visualized ectopic pregnancy. Recommend correlation with serial beta-hCG levels, and follow up US if warranted clinically. Electronically Signed: By: Marlaine Hind M.D. On: 09/10/2019 19:39   MAU Course  Procedures  MDM -r/o ectopic with N/V + abdominal pain with known marijuana use and previous episode of extremely similar symptoms in 04/2019 -UA: 80 ketones, otherwise WNL -UDS: +THC only -CBC w/ Diff: WBCs 13.6, elevated neutrophils, otherwise WNL -CMP: WNL -Lipase: WNL -US OB: pregnancy of unknown location -US Abdomen: small polyp in gallbladder, needs repeat scan in 1 year, no acute findings -hCG: 51 -ABO: AB Positive -WetPrep: +yeast, otherwise WNL -GC/CT collected -2L LR + 10mg  Reglan + 20mg  Pepcid given, pt reports N/V/abdominal pain have resolved -PO challenge successful -pt discharged to home in stable condition  Orders Placed This Encounter  Procedures  . Wet prep, genital    Standing Status:   Standing    Number of Occurrences:   1  . US OB LESS THAN 14 WEEKS WITH OB TRANSVAGINAL    Standing Status:   Standing    Number of Occurrences:   1    Order Specific Question:   Symptom/Reason for Exam    Answer:   Abdominal pain in pregnancy [[id number]]  . US Abdomen Complete    Standing Status:   Standing    Number of Occurrences:   1    Order Specific Question:   Symptom/Reason for Exam    Answer:   LUQ  abdominal tenderness [[id number]]  . US OB LESS THAN 14 WEEKS WITH OB TRANSVAGINAL    Please schedule patient for follow-up US, Monday - Thursday between 8:00 am - 10:00 am and 12:00 pm - 3:00 pm. The patient will follow-up with [facility] immediately after [place] for results.    Standing Status:   Future    Standing Expiration Date:   11/09/2020    Order Specific Question:   Reason for Exam (SYMPTOM  OR DIAGNOSIS REQUIRED)    Answer:   pregnancy of unknown location    Order Specific Question:   Preferred Imaging Location?    Answer:   [facility]  . Lipase, blood    Standing Status:   Standing    Number of Occurrences:   1  . Comprehensive  metabolic panel    Standing Status:   Standing    Number of Occurrences:   1  . CBC    Standing Status:   Standing    Number of Occurrences:   1  . Urinalysis, Routine w reflex microscopic    Standing Status:   Standing    Number of Occurrences:   1  . hCG, quantitative, pregnancy    Standing Status:   Standing    Number of Occurrences:   1  . CBC with Differential/Platelet    Standing Status:   Standing    Number of Occurrences:   1  . Urine rapid drug screen (hosp performed)    Standing Status:   Standing    Number of Occurrences:   1  . Diet NPO time specified    Standing Status:   Standing    Number of Occurrences:   1  . Saline Lock IV, Maintain IV access    Standing Status:   Standing    Number of Occurrences:   1  . Contact Isolation: Enteric    Standing Status:   Standing    Number of Occurrences:   1  . I-Stat beta hCG blood, ED    Standing Status:   Standing    Number of Occurrences:   1  . ABO/Rh    Standing Status:   Standing    Number of Occurrences:   1  . Insert peripheral IV    Standing Status:   Standing    Number of Occurrences:   1  . Discharge patient    Order Specific Question:   Discharge disposition    Answer:   01-Home or Self Care [1]    Order Specific Question:   Discharge patient date    Answer:   09/10/2019    Meds ordered this encounter  Medications  . lactated ringers bolus 1,000 mL  . famotidine (PEPCID) IVPB 20 mg premix  . metoCLOPramide (REGLAN) injection 10 mg  . lactated ringers infusion  . DISCONTD: lactated ringers bolus 1,000 mL  . terconazole (TERAZOL 7) 0.4 % vaginal cream    Sig: Place 1 applicator vaginally at bedtime for 7 days.    Dispense:  45 g    Refill:  0    Order Specific Question:   Supervising Provider    Answer:   Conan Bowens [[id number]]  . famotidine (PEPCID) 10 MG tablet    Sig: Take 1 tablet (10 mg total) by mouth 2 (two) times daily.    Dispense:  60 tablet    Refill:  0    Order Specific Question:   Supervising Provider    Answer:   Conan Bowens [[id number]]  . metoCLOPramide (REGLAN) 10 MG tablet    Sig: Take 1 tablet (10 mg total) by mouth 3 (three) times daily with meals.    Dispense:  90 tablet    Refill:  0    Order Specific Question:   Supervising Provider    Answer:   Conan Bowens [[id number]]  . metoCLOPramide (REGLAN) 10 MG tablet    Sig: Take 1 tablet (10 mg total) by mouth every 6 (six) hours as needed for nausea or vomiting (nausea/headache).    Dispense:  30 tablet    Refill:  0    Order Specific Question:   Supervising Provider    Answer:   Conan Bowens [[id number]]   Assessment and Plan   1. Pregnancy of unknown anatomic location  2. Abdominal pain in pregnancy   3. LUQ abdominal tenderness   4. Vaginal yeast infection   5. 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 Nausea and vomiting in pregnancy   7. Polyp of gallbladder     -will call with culture results, if positive -return to MAU around 330pm on Thursday 09/12/2019 or the morning of Friday 09/13/2019 for repeat hCG -repeat US in 10-14days -pt advised not to restart smoking marijuana for N/V or other reason as this can worsen symptoms of N/V, information given on cannabis hyperemesis -safe meds in pregnancy list given -pt advised  to take medications around the clock and not to stop taking if feeling better -discussed nonpharmacologic and pharmacologic treatments of N/V -discussed normal expectations for N/V in pregnancy -strict ectopic/bleeding/pain/hyperemesis precautions given -pt discharged to home in stable condition  Joni Reining E Nugent 09/10/2019, 9:30 PM

## 2019-09-10 NOTE — ED Triage Notes (Signed)
Pt reports abd pain and emesis since Sunday, pt has hx of same with marijuana abuse. Pt seen at [facility] yesterday for the same, states "they did not do anything"/ Pt dry heaving in triage.

## 2019-09-12 DIAGNOSIS — R112 Nausea with vomiting, unspecified: Secondary | ICD-10-CM | POA: Diagnosis not present

## 2019-09-12 DIAGNOSIS — Z3201 Encounter for pregnancy test, result positive: Secondary | ICD-10-CM | POA: Diagnosis not present

## 2019-09-13 LAB — GC/CHLAMYDIA PROBE AMP (~~LOC~~) NOT AT ARMC
Chlamydia: NEGATIVE
Comment: NEGATIVE
Comment: NORMAL
Neisseria Gonorrhea: NEGATIVE

## 2019-09-30 DIAGNOSIS — L732 Hidradenitis suppurativa: Secondary | ICD-10-CM | POA: Diagnosis not present

## 2019-09-30 DIAGNOSIS — A630 Anogenital (venereal) warts: Secondary | ICD-10-CM | POA: Diagnosis not present

## 2019-09-30 DIAGNOSIS — E282 Polycystic ovarian syndrome: Secondary | ICD-10-CM | POA: Diagnosis not present

## 2019-09-30 DIAGNOSIS — N911 Secondary amenorrhea: Secondary | ICD-10-CM | POA: Diagnosis not present

## 2019-10-03 DIAGNOSIS — Z3685 Encounter for antenatal screening for Streptococcus B: Secondary | ICD-10-CM | POA: Diagnosis not present

## 2019-10-03 DIAGNOSIS — Z3481 Encounter for supervision of other normal pregnancy, first trimester: Secondary | ICD-10-CM | POA: Diagnosis not present

## 2019-10-03 LAB — OB RESULTS CONSOLE ANTIBODY SCREEN: Antibody Screen: NEGATIVE

## 2019-10-03 LAB — OB RESULTS CONSOLE GC/CHLAMYDIA
Chlamydia: NEGATIVE
Gonorrhea: NEGATIVE

## 2019-10-03 LAB — OB RESULTS CONSOLE ABO/RH: RH Type: POSITIVE

## 2019-10-03 LAB — OB RESULTS CONSOLE RUBELLA ANTIBODY, IGM: Rubella: IMMUNE

## 2019-10-03 LAB — OB RESULTS CONSOLE RPR: RPR: NONREACTIVE

## 2019-10-03 LAB — OB RESULTS CONSOLE HEPATITIS B SURFACE ANTIGEN: Hepatitis B Surface Ag: NEGATIVE

## 2019-10-03 LAB — OB RESULTS CONSOLE HIV ANTIBODY (ROUTINE TESTING): HIV: NONREACTIVE

## 2019-10-18 NOTE — L&D Delivery Note (Signed)
Delivery Note At 2:53 PM a viable female was delivered via Vaginal, Spontaneous (Presentation: Right Occiput Anterior).  APGAR: 9, 9; weight 8 lb 8.9 oz (3881 g).   Placenta status: Spontaneous, Intact.  Cord: 3 vessels with the following complications: None.  Cord pH: not sent  Anesthesia: Epidural Episiotomy: None Lacerations: 3rd degree - 3A;Bilateral Sulcus repaired in running stitch with 3'0.  #a tear Repaired in the usual fashion, rectal exams x 2 without any evidence of occult fourth Suture Repair: 2.0 3.0 vicryl Est. Blood Loss (mL): 1510cc - uterine atony requiring pitocin, fundal massage, cytotec rectally, TXA 1g, and hemabate x 1. Ultimately resolved and patient with appropriate lochia. Hgb 11. PLTs WNL. Fibrinogen pending.    It's a boy - "Marilyn Richardson"!!   Mom to postpartum.  Baby to Couplet care / Skin to Skin.  Ranae Pila 05/18/2020, 4:53 PM

## 2019-10-31 DIAGNOSIS — Z113 Encounter for screening for infections with a predominantly sexual mode of transmission: Secondary | ICD-10-CM | POA: Diagnosis not present

## 2019-10-31 DIAGNOSIS — Z331 Pregnant state, incidental: Secondary | ICD-10-CM | POA: Diagnosis not present

## 2019-10-31 DIAGNOSIS — Z3401 Encounter for supervision of normal first pregnancy, first trimester: Secondary | ICD-10-CM | POA: Diagnosis not present

## 2019-11-12 DIAGNOSIS — Z3682 Encounter for antenatal screening for nuchal translucency: Secondary | ICD-10-CM | POA: Diagnosis not present

## 2019-11-12 DIAGNOSIS — Z3A12 12 weeks gestation of pregnancy: Secondary | ICD-10-CM | POA: Diagnosis not present

## 2019-11-26 DIAGNOSIS — Z3480 Encounter for supervision of other normal pregnancy, unspecified trimester: Secondary | ICD-10-CM | POA: Diagnosis not present

## 2019-11-26 DIAGNOSIS — O26852 Spotting complicating pregnancy, second trimester: Secondary | ICD-10-CM | POA: Diagnosis not present

## 2019-11-26 DIAGNOSIS — Z3A14 14 weeks gestation of pregnancy: Secondary | ICD-10-CM | POA: Diagnosis not present

## 2019-12-27 DIAGNOSIS — Z363 Encounter for antenatal screening for malformations: Secondary | ICD-10-CM | POA: Diagnosis not present

## 2019-12-27 DIAGNOSIS — Z3682 Encounter for antenatal screening for nuchal translucency: Secondary | ICD-10-CM | POA: Diagnosis not present

## 2019-12-27 DIAGNOSIS — Z3A18 18 weeks gestation of pregnancy: Secondary | ICD-10-CM | POA: Diagnosis not present

## 2020-02-25 DIAGNOSIS — Z23 Encounter for immunization: Secondary | ICD-10-CM | POA: Diagnosis not present

## 2020-02-25 DIAGNOSIS — Z3480 Encounter for supervision of other normal pregnancy, unspecified trimester: Secondary | ICD-10-CM | POA: Diagnosis not present

## 2020-02-28 DIAGNOSIS — O99810 Abnormal glucose complicating pregnancy: Secondary | ICD-10-CM | POA: Diagnosis not present

## 2020-04-13 DIAGNOSIS — O368130 Decreased fetal movements, third trimester, not applicable or unspecified: Secondary | ICD-10-CM | POA: Diagnosis not present

## 2020-04-13 DIAGNOSIS — Z3A34 34 weeks gestation of pregnancy: Secondary | ICD-10-CM | POA: Diagnosis not present

## 2020-04-27 DIAGNOSIS — Z3685 Encounter for antenatal screening for Streptococcus B: Secondary | ICD-10-CM | POA: Diagnosis not present

## 2020-04-27 LAB — OB RESULTS CONSOLE GBS: GBS: NEGATIVE

## 2020-05-05 DIAGNOSIS — O368130 Decreased fetal movements, third trimester, not applicable or unspecified: Secondary | ICD-10-CM | POA: Diagnosis not present

## 2020-05-05 DIAGNOSIS — Z3A37 37 weeks gestation of pregnancy: Secondary | ICD-10-CM | POA: Diagnosis not present

## 2020-05-12 ENCOUNTER — Telehealth (HOSPITAL_COMMUNITY): Payer: Self-pay | Admitting: *Deleted

## 2020-05-12 ENCOUNTER — Encounter (HOSPITAL_COMMUNITY): Payer: Self-pay | Admitting: *Deleted

## 2020-05-12 NOTE — Telephone Encounter (Signed)
Preadmission screen  

## 2020-05-15 NOTE — H&P (Signed)
Marilyn Richardson is a 24 y.o. female presenting for elective IOL. 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 She quit marijuana >8 months ago. Expecting a boy.   OB History    Gravida  1   Para      Term      Preterm      AB      Living        SAB      TAB      Ectopic      Multiple      Live Births             Past Medical History:  Diagnosis Date  . Anxiety   . Depression    doing good  . Endometriosis   . Hidradenitis   . Infection    UTI  . Medical history non-contributory   . PCOS (polycystic ovarian syndrome)    Past Surgical History:  Procedure Laterality Date  . NO PAST SURGERIES    . WISDOM TOOTH EXTRACTION     Family History: family history includes Depression in her father; Factor V Leiden deficiency in her maternal aunt, maternal grandmother, and mother. Social History:  reports that she quit smoking about 2 years ago. She has a 5.00 pack-year smoking history. She has never used smokeless tobacco. She reports previous alcohol use. She reports current drug use. Drug: Marijuana.     Maternal Diabetes: No Genetic Screening: Normal Maternal Ultrasounds/Referrals: Normal Fetal Ultrasounds or other Referrals:  None Maternal Substance Abuse:  No Significant Maternal Medications:  None Significant Maternal Lab Results:  None Other Comments:  None  Review of Systems History   There were no vitals taken for this visit. Exam Physical Exam  (from office) NAD, A&O NWOB Abd soft, nondistended, gravid  Prenatal labs: ABO, Rh: AB/Positive/-- (12/17 0000) Antibody: Negative (12/17 0000) Rubella: Immune (12/17 0000) RPR: Nonreactive (12/17 0000)  HBsAg: Negative (12/17 0000)  HIV: Non-reactive (12/17 0000)  GBS: Negative/-- (07/12 0000)   Assessment/Plan: 23 yo G1P0 @ 39.0 presenting for IOL s/s elective. Cervix unfavorable. Plan for cytotec followed by pitocin/AROM when more favorable.   GBS negative.     Ranae Pila 05/15/2020, 8:37 AM

## 2020-05-16 ENCOUNTER — Other Ambulatory Visit (HOSPITAL_COMMUNITY)
Admission: RE | Admit: 2020-05-16 | Discharge: 2020-05-16 | Disposition: A | Discharge status: Home / Self Care | Payer: Federal, State, Local not specified - PPO | Source: Ambulatory Visit | Attending: Obstetrics and Gynecology | Admitting: Obstetrics and Gynecology

## 2020-05-16 DIAGNOSIS — Z01812 Encounter for preprocedural laboratory examination: Secondary | ICD-10-CM | POA: Insufficient documentation

## 2020-05-16 DIAGNOSIS — Z87891 Personal history of nicotine dependence: Secondary | ICD-10-CM | POA: Diagnosis not present

## 2020-05-16 DIAGNOSIS — O26893 Other specified pregnancy related conditions, third trimester: Secondary | ICD-10-CM | POA: Diagnosis not present

## 2020-05-16 DIAGNOSIS — Z3A39 39 weeks gestation of pregnancy: Secondary | ICD-10-CM | POA: Diagnosis not present

## 2020-05-16 DIAGNOSIS — Z91040 Latex allergy status: Secondary | ICD-10-CM | POA: Diagnosis not present

## 2020-05-16 DIAGNOSIS — Z20822 Contact with and (suspected) exposure to covid-19: Secondary | ICD-10-CM | POA: Insufficient documentation

## 2020-05-16 LAB — SARS CORONAVIRUS 2 (TAT 6-24 HRS): SARS Coronavirus 2: NEGATIVE

## 2020-05-18 ENCOUNTER — Encounter (HOSPITAL_COMMUNITY): Payer: Self-pay | Admitting: Obstetrics and Gynecology

## 2020-05-18 ENCOUNTER — Inpatient Hospital Stay (HOSPITAL_COMMUNITY): Payer: Federal, State, Local not specified - PPO

## 2020-05-18 ENCOUNTER — Inpatient Hospital Stay (HOSPITAL_COMMUNITY)
Admission: AD | Admit: 2020-05-18 | Discharge: 2020-05-20 | DRG: 768 | Disposition: A | Discharge status: Home / Self Care | Payer: Federal, State, Local not specified - PPO | Attending: Obstetrics and Gynecology | Admitting: Obstetrics and Gynecology

## 2020-05-18 ENCOUNTER — Inpatient Hospital Stay (HOSPITAL_COMMUNITY): Payer: Federal, State, Local not specified - PPO | Admitting: Anesthesiology

## 2020-05-18 ENCOUNTER — Other Ambulatory Visit: Payer: Self-pay

## 2020-05-18 DIAGNOSIS — O26893 Other specified pregnancy related conditions, third trimester: Secondary | ICD-10-CM | POA: Diagnosis present

## 2020-05-18 DIAGNOSIS — O3680X0 Pregnancy with inconclusive fetal viability, not applicable or unspecified: Secondary | ICD-10-CM

## 2020-05-18 DIAGNOSIS — Z87891 Personal history of nicotine dependence: Secondary | ICD-10-CM

## 2020-05-18 DIAGNOSIS — Z91040 Latex allergy status: Secondary | ICD-10-CM

## 2020-05-18 DIAGNOSIS — Z3A39 39 weeks gestation of pregnancy: Secondary | ICD-10-CM

## 2020-05-18 DIAGNOSIS — K824 Cholesterolosis of gallbladder: Secondary | ICD-10-CM

## 2020-05-18 DIAGNOSIS — Z20822 Contact with and (suspected) exposure to covid-19: Secondary | ICD-10-CM | POA: Diagnosis present

## 2020-05-18 DIAGNOSIS — Z3490 Encounter for supervision of normal pregnancy, unspecified, unspecified trimester: Secondary | ICD-10-CM

## 2020-05-18 LAB — TYPE AND SCREEN
ABO/RH(D): AB POS
Antibody Screen: NEGATIVE

## 2020-05-18 LAB — CBC
HCT: 34.8 % — ABNORMAL LOW (ref 36.0–46.0)
HCT: 35.7 % — ABNORMAL LOW (ref 36.0–46.0)
Hemoglobin: 11.4 g/dL — ABNORMAL LOW (ref 12.0–15.0)
Hemoglobin: 11.9 g/dL — ABNORMAL LOW (ref 12.0–15.0)
MCH: 32.2 pg (ref 26.0–34.0)
MCH: 32.5 pg (ref 26.0–34.0)
MCHC: 32.8 g/dL (ref 30.0–36.0)
MCHC: 33.3 g/dL (ref 30.0–36.0)
MCV: 96.7 fL (ref 80.0–100.0)
MCV: 99.1 fL (ref 80.0–100.0)
Platelets: 168 10*3/uL (ref 150–400)
Platelets: 176 10*3/uL (ref 150–400)
RBC: 3.51 MIL/uL — ABNORMAL LOW (ref 3.87–5.11)
RBC: 3.69 MIL/uL — ABNORMAL LOW (ref 3.87–5.11)
RDW: 13.1 % (ref 11.5–15.5)
RDW: 13.1 % (ref 11.5–15.5)
WBC: 11.4 10*3/uL — ABNORMAL HIGH (ref 4.0–10.5)
WBC: 21.8 10*3/uL — ABNORMAL HIGH (ref 4.0–10.5)
nRBC: 0 % (ref 0.0–0.2)
nRBC: 0 % (ref 0.0–0.2)

## 2020-05-18 LAB — DIC (DISSEMINATED INTRAVASCULAR COAGULATION)PANEL
D-Dimer, Quant: 3.5 ug/mL-FEU — ABNORMAL HIGH (ref 0.00–0.50)
Fibrinogen: 455 mg/dL (ref 210–475)
INR: 1 (ref 0.8–1.2)
Platelets: 188 10*3/uL (ref 150–400)
Prothrombin Time: 13.2 seconds (ref 11.4–15.2)
Smear Review: NONE SEEN
aPTT: 26 seconds (ref 24–36)

## 2020-05-18 LAB — RPR: RPR Ser Ql: NONREACTIVE

## 2020-05-18 MED ORDER — FENTANYL-BUPIVACAINE-NACL 0.5-0.125-0.9 MG/250ML-% EP SOLN
12.0000 mL/h | EPIDURAL | Status: DC | PRN
  Filled 2020-05-18: qty 250

## 2020-05-18 MED ORDER — PHENYLEPHRINE 40 MCG/ML (10ML) SYRINGE FOR IV PUSH (FOR BLOOD PRESSURE SUPPORT): 80.0000 ug | PREFILLED_SYRINGE | INTRAVENOUS | Status: DC | PRN

## 2020-05-18 MED ORDER — OXYCODONE HCL 5 MG PO TABS
5.0000 mg | ORAL_TABLET | ORAL | Status: DC | PRN
  Administered 2020-05-19 – 2020-05-20 (×4): 5 mg via ORAL
  Filled 2020-05-18 (×3): qty 1

## 2020-05-18 MED ORDER — HYDROXYZINE HCL 50 MG PO TABS: 50.0000 mg | ORAL_TABLET | Freq: Four times a day (QID) | ORAL | Status: DC | PRN

## 2020-05-18 MED ORDER — MISOPROSTOL 200 MCG PO TABS
800.0000 ug | ORAL_TABLET | Freq: Once | ORAL | Status: AC
  Administered 2020-05-18: 800 ug via RECTAL

## 2020-05-18 MED ORDER — ONDANSETRON HCL 4 MG/2ML IJ SOLN: 4.0000 mg | INTRAMUSCULAR | Status: DC | PRN

## 2020-05-18 MED ORDER — BENZOCAINE-MENTHOL 20-0.5 % EX AERO
1.0000 "application " | INHALATION_SPRAY | CUTANEOUS | Status: DC | PRN
  Administered 2020-05-18: 1 via TOPICAL
  Filled 2020-05-18: qty 56

## 2020-05-18 MED ORDER — OXYTOCIN-SODIUM CHLORIDE 30-0.9 UT/500ML-% IV SOLN
2.5000 [IU]/h | INTRAVENOUS | Status: DC
  Administered 2020-05-18: 2.5 [IU]/h via INTRAVENOUS

## 2020-05-18 MED ORDER — OXYCODONE-ACETAMINOPHEN 5-325 MG PO TABS: 2.0000 | ORAL_TABLET | ORAL | Status: DC | PRN

## 2020-05-18 MED ORDER — SOD CITRATE-CITRIC ACID 500-334 MG/5ML PO SOLN: 30.0000 mL | ORAL | Status: DC | PRN

## 2020-05-18 MED ORDER — LIDOCAINE HCL (PF) 1 % IJ SOLN
INTRAMUSCULAR | Status: DC | PRN
  Administered 2020-05-18: 11 mL via EPIDURAL

## 2020-05-18 MED ORDER — ACETAMINOPHEN 325 MG PO TABS
650.0000 mg | ORAL_TABLET | ORAL | Status: DC | PRN
  Administered 2020-05-18: 650 mg via ORAL
  Filled 2020-05-18: qty 2

## 2020-05-18 MED ORDER — DIPHENHYDRAMINE HCL 25 MG PO CAPS: 25.0000 mg | ORAL_CAPSULE | Freq: Four times a day (QID) | ORAL | Status: DC | PRN

## 2020-05-18 MED ORDER — EPHEDRINE 5 MG/ML INJ: 10.0000 mg | INTRAVENOUS | Status: DC | PRN

## 2020-05-18 MED ORDER — LACTATED RINGERS IV SOLN: INTRAVENOUS | Status: DC

## 2020-05-18 MED ORDER — DIBUCAINE (PERIANAL) 1 % EX OINT: 1.0000 "application " | TOPICAL_OINTMENT | CUTANEOUS | Status: DC | PRN

## 2020-05-18 MED ORDER — WITCH HAZEL-GLYCERIN EX PADS
1.0000 "application " | MEDICATED_PAD | CUTANEOUS | Status: DC | PRN
  Administered 2020-05-18: 1 via TOPICAL

## 2020-05-18 MED ORDER — TERBUTALINE SULFATE 1 MG/ML IJ SOLN: 0.2500 mg | Freq: Once | INTRAMUSCULAR | Status: DC | PRN

## 2020-05-18 MED ORDER — TRANEXAMIC ACID-NACL 1000-0.7 MG/100ML-% IV SOLN: 1000.0000 mg | Freq: Once | INTRAVENOUS | Status: DC | PRN

## 2020-05-18 MED ORDER — SENNOSIDES-DOCUSATE SODIUM 8.6-50 MG PO TABS
2.0000 | ORAL_TABLET | ORAL | Status: DC
  Administered 2020-05-18 – 2020-05-19 (×2): 2 via ORAL
  Filled 2020-05-18 (×2): qty 2

## 2020-05-18 MED ORDER — MISOPROSTOL 200 MCG PO TABS
ORAL_TABLET | ORAL | Status: AC
  Filled 2020-05-18: qty 4

## 2020-05-18 MED ORDER — OXYTOCIN BOLUS FROM INFUSION
333.0000 mL | Freq: Once | INTRAVENOUS | Status: AC
  Administered 2020-05-18: 333 mL via INTRAVENOUS

## 2020-05-18 MED ORDER — BUTORPHANOL TARTRATE 1 MG/ML IJ SOLN: 1.0000 mg | INTRAMUSCULAR | Status: DC | PRN

## 2020-05-18 MED ORDER — ZOLPIDEM TARTRATE 5 MG PO TABS: 5.0000 mg | ORAL_TABLET | Freq: Every evening | ORAL | Status: DC | PRN

## 2020-05-18 MED ORDER — ACETAMINOPHEN 325 MG PO TABS
650.0000 mg | ORAL_TABLET | ORAL | Status: DC | PRN
  Administered 2020-05-19 – 2020-05-20 (×3): 650 mg via ORAL
  Filled 2020-05-18 (×3): qty 2

## 2020-05-18 MED ORDER — SODIUM CHLORIDE (PF) 0.9 % IJ SOLN
INTRAMUSCULAR | Status: DC | PRN
  Administered 2020-05-18: 12 mL/h via EPIDURAL

## 2020-05-18 MED ORDER — LIDOCAINE HCL (PF) 1 % IJ SOLN: INTRAMUSCULAR | Status: DC | PRN

## 2020-05-18 MED ORDER — OXYCODONE HCL 5 MG PO TABS
10.0000 mg | ORAL_TABLET | ORAL | Status: DC | PRN
  Administered 2020-05-19 (×2): 10 mg via ORAL
  Filled 2020-05-18 (×3): qty 2

## 2020-05-18 MED ORDER — SIMETHICONE 80 MG PO CHEW
80.0000 mg | CHEWABLE_TABLET | ORAL | Status: DC | PRN
  Filled 2020-05-18: qty 1

## 2020-05-18 MED ORDER — PRENATAL MULTIVITAMIN CH
1.0000 | ORAL_TABLET | Freq: Every day | ORAL | Status: DC
  Administered 2020-05-19 – 2020-05-20 (×2): 1 via ORAL
  Filled 2020-05-18 (×2): qty 1

## 2020-05-18 MED ORDER — ONDANSETRON HCL 4 MG/2ML IJ SOLN
4.0000 mg | Freq: Four times a day (QID) | INTRAMUSCULAR | Status: DC | PRN
  Administered 2020-05-18: 4 mg via INTRAVENOUS
  Filled 2020-05-18: qty 2

## 2020-05-18 MED ORDER — COCONUT OIL OIL
1.0000 "application " | TOPICAL_OIL | Status: DC | PRN
  Administered 2020-05-18: 1 via TOPICAL

## 2020-05-18 MED ORDER — LACTATED RINGERS IV SOLN
500.0000 mL | Freq: Once | INTRAVENOUS | Status: AC
  Administered 2020-05-18: 500 mL via INTRAVENOUS

## 2020-05-18 MED ORDER — DIPHENHYDRAMINE HCL 50 MG/ML IJ SOLN: 12.5000 mg | INTRAMUSCULAR | Status: DC | PRN

## 2020-05-18 MED ORDER — TRANEXAMIC ACID-NACL 1000-0.7 MG/100ML-% IV SOLN
1000.0000 mg | INTRAVENOUS | Status: AC
  Administered 2020-05-18: 1000 mg via INTRAVENOUS

## 2020-05-18 MED ORDER — OXYTOCIN-SODIUM CHLORIDE 30-0.9 UT/500ML-% IV SOLN
1.0000 m[IU]/min | INTRAVENOUS | Status: DC
  Administered 2020-05-18: 2 m[IU]/min via INTRAVENOUS
  Filled 2020-05-18: qty 500

## 2020-05-18 MED ORDER — CARBOPROST TROMETHAMINE 250 MCG/ML IM SOLN
INTRAMUSCULAR | Status: AC
  Filled 2020-05-18: qty 1

## 2020-05-18 MED ORDER — TRANEXAMIC ACID-NACL 1000-0.7 MG/100ML-% IV SOLN
INTRAVENOUS | Status: AC
  Filled 2020-05-18: qty 100

## 2020-05-18 MED ORDER — IBUPROFEN 600 MG PO TABS
600.0000 mg | ORAL_TABLET | Freq: Four times a day (QID) | ORAL | Status: DC
  Administered 2020-05-18 – 2020-05-20 (×8): 600 mg via ORAL
  Filled 2020-05-18 (×8): qty 1

## 2020-05-18 MED ORDER — TETANUS-DIPHTH-ACELL PERTUSSIS 5-2.5-18.5 LF-MCG/0.5 IM SUSP: 0.5000 mL | Freq: Once | INTRAMUSCULAR | Status: DC

## 2020-05-18 MED ORDER — LIDOCAINE HCL (PF) 1 % IJ SOLN: 30.0000 mL | INTRAMUSCULAR | Status: DC | PRN

## 2020-05-18 MED ORDER — OXYCODONE-ACETAMINOPHEN 5-325 MG PO TABS: 1.0000 | ORAL_TABLET | ORAL | Status: DC | PRN

## 2020-05-18 MED ORDER — LACTATED RINGERS IV SOLN: 500.0000 mL | INTRAVENOUS | Status: DC | PRN

## 2020-05-18 MED ORDER — CARBOPROST TROMETHAMINE 250 MCG/ML IM SOLN: 250.0000 ug | INTRAMUSCULAR | Status: DC | PRN

## 2020-05-18 MED ORDER — ONDANSETRON HCL 4 MG PO TABS: 4.0000 mg | ORAL_TABLET | ORAL | Status: DC | PRN

## 2020-05-18 MED ORDER — MISOPROSTOL 25 MCG QUARTER TABLET
25.0000 ug | ORAL_TABLET | ORAL | Status: DC | PRN
  Administered 2020-05-18 (×2): 25 ug via VAGINAL
  Filled 2020-05-18 (×2): qty 1

## 2020-05-18 NOTE — Progress Notes (Signed)
Pushing with good effort Continue to push

## 2020-05-18 NOTE — Lactation Note (Addendum)
This note was copied from a baby's chart. Lactation Consultation Note  Patient Name: Marilyn Richardson Today's Date: 05/18/2020 Reason for consult: Initial assessment;1st time breastfeeding;Term P1, 3 hour female infant. 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  Per mom, she attended BF classes online at [facility] and she has DEBP at home. Per mom, breast are sore and she had few painful latches.  LC entered the room, infant was cuing to BF. Mom latched infant on her right breast using the football hold position, LC asked mom hand express a small amount of colostrum out prior to latching infant at breast. Mom brought infant chin first, wide mouth,  tongue down, top lip was flanged outward, per mom, she could feel a difference with latch.  Mom mention to maternal grandmother she see difference in how infant's top lip is flanged out more.  Infant was still BF when LC left the room after 10 minutes.. Mom was given coconut oil by RN and LC explained how to use coconut oil. Mom will continue to BF according to cues, on demand, 8 to 12+ times within 24 hours. Mom knows to ask RN or LC for assistance with latching infant at breast if needed. Reviewed Baby & Me book's Breastfeeding Basics.  Mom made aware of O/P services, breastfeeding support groups, community resources, and our phone # for post-discharge questions.  Maternal Data Formula Feeding for Exclusion: No Has patient been taught Hand Expression?: Yes Does the patient have breastfeeding experience prior to this delivery?: No  Feeding Feeding Type: Breast Fed  LATCH Score Latch: Grasps breast easily, tongue down, lips flanged, rhythmical sucking.  Audible Swallowing: Spontaneous and intermittent  Type of Nipple: Everted at rest and after stimulation  Comfort (Breast/Nipple): Filling, red/small blisters or bruises, mild/mod discomfort  Hold (Positioning): Assistance needed to correctly position infant at breast and  maintain latch.  LATCH Score: 8  Interventions Interventions: Breast feeding basics reviewed;Assisted with latch;Skin to skin;Adjust position;Coconut oil;Support pillows;Breast massage;Hand express;Position options  Lactation Tools Discussed/Used Tools: Coconut oil WIC Program: No   Consult Status Consult Status: Follow-up Date: 05/19/20 Follow-up type: In-patient    Danelle Earthly 05/18/2020, 6:38 PM

## 2020-05-18 NOTE — Anesthesia Procedure Notes (Signed)
Epidural Patient location during procedure: OB Start time: 05/18/2020 8:58 AM End time: 05/18/2020 9:15 AM  Staffing Anesthesiologist: Lowella Curb, MD Performed: anesthesiologist   Preanesthetic Checklist Completed: patient identified, IV checked, site marked, risks and benefits discussed, surgical consent, monitors and equipment checked, pre-op evaluation and timeout performed  Epidural Patient position: sitting Prep: ChloraPrep Patient monitoring: heart rate, cardiac monitor, continuous pulse ox and blood pressure Approach: midline Location: L2-L3 Injection technique: LOR saline  Needle:  Needle type: Tuohy  Needle gauge: 17 G Needle length: 9 cm Needle insertion depth: 5 cm Catheter type: closed end flexible Catheter size: 20 Guage Catheter at skin depth: 9 cm Test dose: negative  Assessment Events: blood not aspirated, injection not painful, no injection resistance, no paresthesia and negative IV test  Additional Notes Reason for block:procedure for pain

## 2020-05-18 NOTE — Anesthesia Preprocedure Evaluation (Signed)
Anesthesia Evaluation  Patient identified by MRN, date of birth, ID band Patient awake    Reviewed: Allergy & Precautions, NPO status , Patient's Chart, lab work & pertinent test results  Airway Mallampati: II  TM Distance: >3 FB Neck ROM: Full    Dental no notable dental hx.    Pulmonary neg pulmonary ROS, former smoker,    Pulmonary exam normal breath sounds clear to auscultation       Cardiovascular negative cardio ROS Normal cardiovascular exam Rhythm:Regular Rate:Normal     Neuro/Psych negative neurological ROS  negative psych ROS   GI/Hepatic negative GI ROS, Neg liver ROS,   Endo/Other  negative endocrine ROS  Renal/GU negative Renal ROS  negative genitourinary   Musculoskeletal negative musculoskeletal ROS (+)   Abdominal   Peds negative pediatric ROS (+)  Hematology negative hematology ROS (+)   Anesthesia Other Findings   Reproductive/Obstetrics (+) Pregnancy                             Anesthesia Physical Anesthesia Plan  ASA: II  Anesthesia Plan: Epidural   Post-op Pain Management:    Induction:   PONV Risk Score and Plan:   Airway Management Planned:   Additional Equipment:   Intra-op Plan:   Post-operative Plan:   Informed Consent:   Plan Discussed with:   Anesthesia Plan Comments:         Anesthesia Quick Evaluation  

## 2020-05-18 NOTE — Progress Notes (Signed)
No changes to H&P.  S/p cytotec x 2 overnight. SVE 3.5/60/-2, AROM clear fluid Start pitocin GSB negative   Rosie Fate MD

## 2020-05-18 NOTE — Progress Notes (Signed)
C/c/+2 push 

## 2020-05-19 LAB — CBC
HCT: 25.7 % — ABNORMAL LOW (ref 36.0–46.0)
Hemoglobin: 8.5 g/dL — ABNORMAL LOW (ref 12.0–15.0)
MCH: 32.8 pg (ref 26.0–34.0)
MCHC: 33.1 g/dL (ref 30.0–36.0)
MCV: 99.2 fL (ref 80.0–100.0)
Platelets: 134 10*3/uL — ABNORMAL LOW (ref 150–400)
RBC: 2.59 MIL/uL — ABNORMAL LOW (ref 3.87–5.11)
RDW: 13.2 % (ref 11.5–15.5)
WBC: 12.1 10*3/uL — ABNORMAL HIGH (ref 4.0–10.5)
nRBC: 0.2 % (ref 0.0–0.2)

## 2020-05-19 NOTE — Progress Notes (Signed)
CSW received consult for hx of marijuana use.  Referral was screened out due to the following: ~MOB had no documented substance use after initial prenatal visit/+UPT. ~MOB had no positive drug screens after initial prenatal visit/+UPT. ~Baby's UDS is negative.  Please consult CSW if current concerns arise or by MOB's request.  CSW will monitor CDS results and make report to Child Protective Services if warranted.    MOB was referred for history of depression/anxiety. * Referral screened out by Clinical Social Worker because none of the following criteria appear to apply: ~ History of anxiety/depression during this pregnancy, or of post-partum depression following prior delivery. ~ Diagnosis of anxiety and/or depression within last 3 years. Per further chart review, MOB diagnosed with panic attacks, PTSD, depression, anxiety in 2017.  OR * MOB's symptoms currently being treated with medication and/or therapy. It is also noted that MOB has active prescription for Lexapro.    Please contact the Clinical Social Worker if needs arise, by MOB request, or if MOB scores greater than 9/yes to question 10 on Edinburgh Postpartum Depression Screen.    Victorina Kable S. Hadi Dubin, MSW, LCSW [facility] [place] [phone]   

## 2020-05-19 NOTE — Lactation Note (Signed)
This note was copied from a baby's chart. Lactation Consultation Note  Patient Name: Marilyn Richardson Today's Date: 05/19/2020 Reason for consult: Follow-up assessment;1st time breastfeeding;Term;Infant weight loss;Other (Comment) (2 % weight loss) Baby is 54 hours old  Mom requested and LC visit through her MBURN.  As LC entered the room mom mentioned the baby has been circ this am and has only fed once since and mom expressed concerned her milk would not come in so she had the [facility] set up a DEBP. She pumped x1 and did not get any milk. LC reassured mom its normal when pumping early it can be a slow process.  Baby noted to be waking up and this LC offered to assist her latch.  Mom is moving very slow and mentioned she has a very sore bottom. LC checked the diaper and it was dry. Baby latched easily on the right breast / football and worked on depth. Baby still feeding at 15 mins with increased swallows with moist warm compress above the baby on the  Breast and compressions. Per mom comfortable with the latch.  LC recommended when the baby is cluster feeding not to pump just feed the baby and if pumping pump after the baby is settled.     Maternal Data Has patient been taught Hand Expression?: Yes  Feeding Feeding Type: Breast Fed  LATCH Score Latch: Grasps breast easily, tongue down, lips flanged, rhythmical sucking.  Audible Swallowing: Spontaneous and intermittent  Type of Nipple: Everted at rest and after stimulation  Comfort (Breast/Nipple): Soft / non-tender  Hold (Positioning): Assistance needed to correctly position infant at breast and maintain latch.  LATCH Score: 9  Interventions Interventions: Breast feeding basics reviewed;Assisted with latch;Skin to skin;Breast massage;Hand express;Breast compression;Adjust position;Support pillows;Position options  Lactation Tools Discussed/Used     Consult Status Consult Status: Follow-up Date: 05/20/20 Follow-up type:  In-patient    Marilyn Richardson 05/19/2020, 12:22 PM

## 2020-05-19 NOTE — Anesthesia Postprocedure Evaluation (Signed)
Anesthesia Post Note  Patient: Erabella S Rake  Procedure(s) Performed: AN AD HOC LABOR EPIDURAL     Patient location during evaluation: Mother Baby Anesthesia Type: Epidural Level of consciousness: awake, awake and alert and oriented Pain management: pain level controlled Vital Signs Assessment: post-procedure vital signs reviewed and stable Respiratory status: spontaneous breathing and respiratory function stable Cardiovascular status: blood pressure returned to baseline Postop Assessment: no headache, epidural receding, patient able to bend at knees, adequate PO intake, no backache, no apparent nausea or vomiting and able to ambulate Anesthetic complications: no   No complications documented.  Last Vitals:  Vitals:   05/18/20 2321 05/19/20 0510  BP: 112/63 120/60  Pulse: 79 76  Resp: 18 18  Temp: 36.8 C 36.8 C  SpO2: 99% 98%    Last Pain:  Vitals:   05/19/20 0511  TempSrc:   PainSc: 7    Pain Goal:                   Cleda Clarks

## 2020-05-19 NOTE — Progress Notes (Signed)
PPD # 1  Doing well no complaints.  BP 120/60 (BP Location: Left Arm)    Pulse 76    Temp 98.2 F (36.8 C) (Oral)    Resp 18    Ht 5\' 5"  (1.651 m)    Wt (!) 95 kg    LMP  (LMP Unknown) Comment: bled a couple days 10/15,10/22,11/2   SpO2 98%    Breastfeeding Unknown    BMI 34.85 kg/m  Results for orders placed or performed during the hospital encounter of 05/18/20 (from the past 24 hour(s))  CBC     Status: Abnormal   Collection Time: 05/18/20  4:33 PM  Result Value Ref Range   WBC 21.8 (H) 4.0 - 10.5 K/uL   RBC 3.51 (L) 3.87 - 5.11 MIL/uL   Hemoglobin 11.4 (L) 12.0 - 15.0 g/dL   HCT 07/18/20 (L) 36 - 46 %   MCV 99.1 80.0 - 100.0 fL   MCH 32.5 26.0 - 34.0 pg   MCHC 32.8 30.0 - 36.0 g/dL   RDW 29.9 24.2 - 68.3 %   Platelets 176 150 - 400 K/uL   nRBC 0.0 0.0 - 0.2 %  DIC Panel (Not at [facility]) ONCE - STAT     Status: Abnormal   Collection Time: 05/18/20  4:33 PM  Result Value Ref Range   Prothrombin Time 13.2 11.4 - 15.2 seconds   INR 1.0 0.8 - 1.2   aPTT 26 24 - 36 seconds   Fibrinogen 455 210 - 475 mg/dL   D-Dimer, Quant 07/18/20 (H) 0.00 - 0.50 ug/mL-FEU   Platelets 188 150 - 400 K/uL   Smear Review NO SCHISTOCYTES SEEN   CBC     Status: Abnormal   Collection Time: 05/19/20  5:49 AM  Result Value Ref Range   WBC 12.1 (H) 4.0 - 10.5 K/uL   RBC 2.59 (L) 3.87 - 5.11 MIL/uL   Hemoglobin 8.5 (L) 12.0 - 15.0 g/dL   HCT 07/19/20 (L) 36 - 46 %   MCV 99.2 80.0 - 100.0 fL   MCH 32.8 26.0 - 34.0 pg   MCHC 33.1 30.0 - 36.0 g/dL   RDW 29.7 98.9 - 21.1 %   Platelets 134 (L) 150 - 400 K/uL   nRBC 0.2 0.0 - 0.2 %   Abdomen is soft and non tender Lochia WNL  IMPRESSION: PPD # 1  PPH Doing well Discharge home tomorrow

## 2020-05-20 MED ORDER — SENNOSIDES-DOCUSATE SODIUM 8.6-50 MG PO TABS: 2.0000 | ORAL_TABLET | ORAL | 0 refills | Status: DC

## 2020-05-20 MED ORDER — OXYCODONE HCL 5 MG PO TABS: 5.0000 mg | ORAL_TABLET | ORAL | 0 refills | Status: DC | PRN

## 2020-05-20 MED ORDER — IBUPROFEN 600 MG PO TABS: 600.0000 mg | ORAL_TABLET | Freq: Four times a day (QID) | ORAL | 0 refills | Status: DC

## 2020-05-20 NOTE — Lactation Note (Signed)
This note was copied from a baby's chart. Lactation Consultation Note  Patient Name: Marilyn Richardson Today's Date: 05/20/2020 Reason for consult: Follow-up assessment Type of Endocrine Disorder?: PCOS   Mother paged for latch assistance. Mother had infant in a sitting position. Infant pinching her nipple. Several attempts to latch infant. Infant on and off for 10 mins. Infant was observed with suckling and a few swallows.   Mother reports that she plans to go to a breastfeeding class in 2 weeks. Suggested that she follow up with OP LC after milk comes in . Mother to continue to follow previous plan that was given. Message sent for OP visit.    Maternal Data    Feeding Feeding Type: Breast Fed  LATCH Score Latch: Repeated attempts needed to sustain latch, nipple held in mouth throughout feeding, stimulation needed to elicit sucking reflex.  Audible Swallowing: A few with stimulation  Type of Nipple: Everted at rest and after stimulation  Comfort (Breast/Nipple): Soft / non-tender  Hold (Positioning): Assistance needed to correctly position infant at breast and maintain latch.  LATCH Score: 7  Interventions    Lactation Tools Discussed/Used     Consult Status Consult Status: Follow-up Date: 05/20/20 Follow-up type: Out-patient    Stevan [facility] 05/20/2020, 3:08 PM

## 2020-05-20 NOTE — Progress Notes (Signed)
Post Partum Day 2 Subjective: no complaints, up ad lib, voiding and tolerating PO.  No light-headedness with ambulation.    Objective: Blood pressure (!) 110/57, pulse 100, temperature 98 F (36.7 C), temperature source Oral, resp. rate 18, height 5\' 5"  (1.651 m), weight (!) 95 kg, SpO2 100 %, unknown if currently breastfeeding.  Physical Exam:  General: alert, cooperative and appears stated age Lochia: appropriate Uterine Fundus: firm Incision: healing well, no significant drainage, no dehiscence DVT Evaluation: No evidence of DVT seen on physical exam. Negative Homan's sign. No cords or calf tenderness.  Recent Labs    05/18/20 1633 05/19/20 0549  HGB 11.4* 8.5*  HCT 34.8* 25.7*    Assessment/Plan: Discharge home and Breastfeeding  Delivery complicated by PPH secondary to atony.  S/P hemabate, cytotec and TXA.  Stable lochia and vitals.     LOS: 2 days   07/19/20 05/20/2020, 8:57 AM

## 2020-05-20 NOTE — Discharge Summary (Signed)
Postpartum Discharge Summary    Patient Name: Marilyn Richardson DOB: February 10, 1996 MRN: [id number]  Date of admission: 05/18/2020 Delivery date:05/18/2020  Delivering provider: Tyson Dense  Date of discharge: 05/20/2020  Admitting diagnosis: Pregnancy [Z34.90] Intrauterine pregnancy: [redacted]w[redacted]d    Secondary diagnosis:  Active Problems:   Pregnancy  Additional problems: Postpartum hemorrhage secondary to atony    Discharge diagnosis: Term Pregnancy Delivered                                              Post partum procedures:none Augmentation: AROM, Pitocin and Cytotec Complications: [id number] Hospital course: Induction of Labor With Vaginal Delivery   24y.o. yo G1P1001 at 349w0das admitted to the hospital 05/18/2020 for induction of labor.  Indication for induction: Elective.  Patient had an uncomplicated labor course as follows: Membrane Rupture Time/Date: 8:11 AM ,05/18/2020   Delivery Method:Vaginal, Spontaneous  Episiotomy: None  Lacerations:  3rd degree;Sulcus  Details of delivery can be found in separate delivery note.  Patient had a routine postpartum course. Patient is discharged home 05/20/20.  Newborn Data: Birth date:05/18/2020  Birth time:2:53 PM  Gender:Female  Living status:Living  Apgars:9 ,9  Weight:3881 g   Magnesium Sulfate received: No BMZ received: No Rhophylac:No MMR:No T-DaP:Given prenatally Flu: No Transfusion:No  Physical exam  Vitals:   05/19/20 0915 05/19/20 1745 05/19/20 2056 05/20/20 0603  BP: (!) 120/58 124/69 127/67 (!) 110/57  Pulse: 95 97 100 100  Resp: _0 Temp: 98.2 F (36.8 C) 98 F (36.7 C) 98 F (36.7 C) 98 F (36.7 C)  TempSrc: Oral Oral Oral Oral  SpO2: 98% 97% 100%   Weight:      Height:       General: alert, cooperative and no distress Lochia: appropriate Uterine Fundus: firm Incision: Healing well with no significant drainage, No significant erythema DVT Evaluation: No evidence of DVT seen on  physical exam. Negative Homan's sign. No cords or calf tenderness. Labs: Lab Results  Component Value Date   WBC 12.1 (H) 05/19/2020   HGB 8.5 (L) 05/19/2020   HCT 25.7 (L) 05/19/2020   MCV 99.2 05/19/2020   PLT 134 (L) 05/19/2020   CMP Latest Ref Rng & Units 09/10/2019  Glucose 70 - 99 mg/dL 114(H)  BUN 6 - 20 mg/dL 7  Creatinine 0.44 - 1.00 mg/dL 0.99  Sodium 135 - 145 mmol/L 137  Potassium 3.5 - 5.1 mmol/L 3.5  Chloride 98 - 111 mmol/L 105  CO2 22 - 32 mmol/L 19(L)  Calcium 8.9 - 10.3 mg/dL 9.2  Total Protein 6.5 - 8.1 g/dL 7.2  Total Bilirubin 0.3 - 1.2 mg/dL 0.8  Alkaline Phos 38 - 126 U/L 54  AST 15 - 41 U/L 25  ALT 0 - 44 U/L 19   Edinburgh Score: Edinburgh Postnatal Depression Scale Screening Tool 05/19/2020  I have been able to laugh and see the funny side of things. 0  I have looked forward with enjoyment to things. 0  I have blamed myself unnecessarily when things went wrong. 1  I have been anxious or worried for no good reason. 2  I have felt scared or panicky for no good reason. 0  Things have been getting on top of me. 0  I have been so unhappy that I have had difficulty sleeping. 0  I  have felt sad or miserable. 0  I have been so unhappy that I have been crying. 1  The thought of harming myself has occurred to me. 0  Edinburgh Postnatal Depression Scale Total 4     After visit meds:  Ibuprofen 600 mg q 6 hours Oxycodone 5 mg q 6 hours #20 Sen[place]ot   Discharge home in stable condition Infant Feeding: Breast Infant Disposition:home with mother Discharge instruction: per After Visit Summary and Postpartum booklet. Activity: Advance as tolerated. Pelvic rest for 6 weeks.  Diet: routine diet Future Appointments:No future appointments. Follow up Visit:6 weeks postpartum visit   05/20/2020 Linda Hedges, DO

## 2020-05-20 NOTE — Lactation Note (Addendum)
This note was copied from a baby's chart. Lactation Consultation Note  Patient Name: Marilyn Richardson Today's Date: 05/20/2020 Reason for consult: Follow-up assessment;Primapara;1st time breastfeeding;Nipple pain/trauma;Term;Maternal endocrine disorder Type of Endocrine Disorder?: PCOS PPH maternal hgb 8.5  LC in to assist P1 Mom of term baby at 5% weight loss. Output 3 voids and 1 stool last 24 hrs. Mom had EBL 1510 with PPH following SVD, Hgb this am is 8.5 on 8/3.  Mom has a 3rd degree tear on perineum.  Mom has history of PCOS and little breast changes with early pregnancy.    Breast massage and hand expression results in clear glistening at nipple.  Mom reporting baby fussy and wanting to breastfeed a lot.  Mom reports her nipples are sore, slight bruising noted otherwise skin on nipples without any visible trauma.  Mom to use her colostrum on nipples.   FOB holding baby and he was cueing and acting fussy.  Watched as Mom placed him in cross cradle hold, without use of support under baby's body.  Mom has flat nipples and compressible areola.  Baby opens very widely and is able to attain a deep latch.  Showed FOB how to assess lower lip which needed flanging.  Deep jaw extensions noted with occasional swallows.  Mom needing a lot of guidance on using a U hold to sandwich her breast and perform alternate breast compression to increase baby's milk transfer.  After 10 mins, baby came off and assisted Mom with football hold on left side.  Mom had not been using the left side due to soreness.  Talked about the benefits of altering baby's positions.  Baby able to attain a deep latch to left breast, baby's lower lip flanged and more regular swallows identified.  Baby fed for 15 mins before becoming sleepy.   Mom encouraged to start on left side at next feeding, always alternating which side she starts with.   DEBP set up in room and Mom states she has only pumped once.  Recommended Mom pump both  breasts after baby breastfeeds.  Use initiation setting on pump, add breast massage and hand expression to express more colostrum to feed back to baby.    Encouraged Mom to keep baby STS and offer breast often with cues.    Engorgement prevention and treatment reviewed. Mom aware of OP lactation support available to her, and encouraged her to call.  Mom plans to attend the [facility].  Feeding Feeding Type: Breast Fed  LATCH Score Latch: Grasps breast easily, tongue down, lips flanged, rhythmical sucking.  Audible Swallowing: Spontaneous and intermittent  Type of Nipple: Flat  Comfort (Breast/Nipple): Filling, red/small blisters or bruises, mild/mod discomfort  Hold (Positioning): Assistance needed to correctly position infant at breast and maintain latch.  LATCH Score: 7  Interventions Interventions: Breast feeding basics reviewed;Assisted with latch;Skin to skin;Breast massage;Hand express;Breast compression;Adjust position;Support pillows;Position options;Hand pump;DEBP  Lactation Tools Discussed/Used Tools: Pump Breast pump type: Double-Electric Breast Pump   Consult Status Consult Status: Complete Date: 05/20/20 Follow-up type: Call as needed    Judee Clara 05/20/2020, 11:23 AM

## 2020-05-21 ENCOUNTER — Inpatient Hospital Stay (HOSPITAL_COMMUNITY): Payer: Federal, State, Local not specified - PPO

## 2020-06-29 DIAGNOSIS — Z1389 Encounter for screening for other disorder: Secondary | ICD-10-CM | POA: Diagnosis not present

## 2020-06-29 DIAGNOSIS — D509 Iron deficiency anemia, unspecified: Secondary | ICD-10-CM | POA: Diagnosis not present

## 2020-09-03 DIAGNOSIS — N842 Polyp of vagina: Secondary | ICD-10-CM | POA: Diagnosis not present

## 2020-09-03 DIAGNOSIS — M25531 Pain in right wrist: Secondary | ICD-10-CM | POA: Diagnosis not present

## 2020-09-24 DIAGNOSIS — Z3202 Encounter for pregnancy test, result negative: Secondary | ICD-10-CM | POA: Diagnosis not present

## 2020-09-24 DIAGNOSIS — N842 Polyp of vagina: Secondary | ICD-10-CM | POA: Diagnosis not present

## 2021-01-11 IMAGING — US US OB < 14 WEEKS - US OB TV
1 series · 14 of 28 positions shown · non-contrast
Comparison: None.
COMPARISON: None.
COMPARISON: None.

Addendum:
CLINICAL DATA: Abdominal pain in 1st trimester pregnancy. Unknown
LMP.

EXAM:
OBSTETRIC <14 WK US AND TRANSVAGINAL OB US
TECHNIQUE: Both transabdominal and transvaginal ultrasound examinations were
performed for complete evaluation of the gestation as well as the
maternal uterus, adnexal regions, and pelvic cul-de-sac.
Transvaginal technique was performed to assess early pregnancy.
CLINICAL DATA: Left upper quadrant abdominal pain and tenderness.
ULTRASOUND ABDOMEN COMPLETE

[Series 1: us ob < 14 weeks - us ob tv · 14 of 58 slices shown]
[im 3/58]
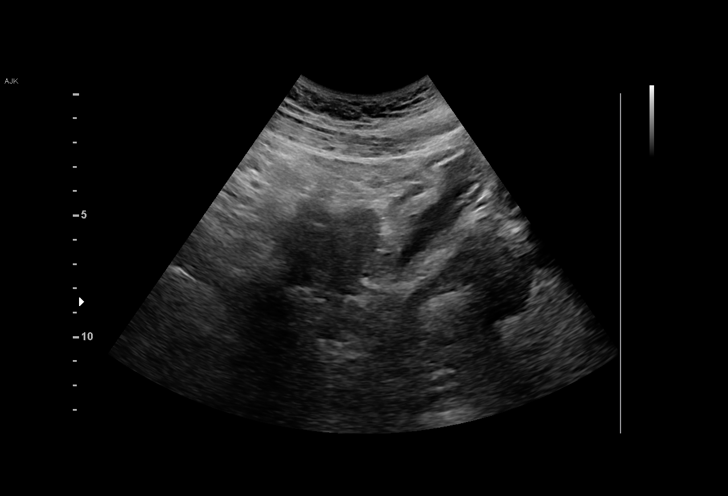
[im 7/58]
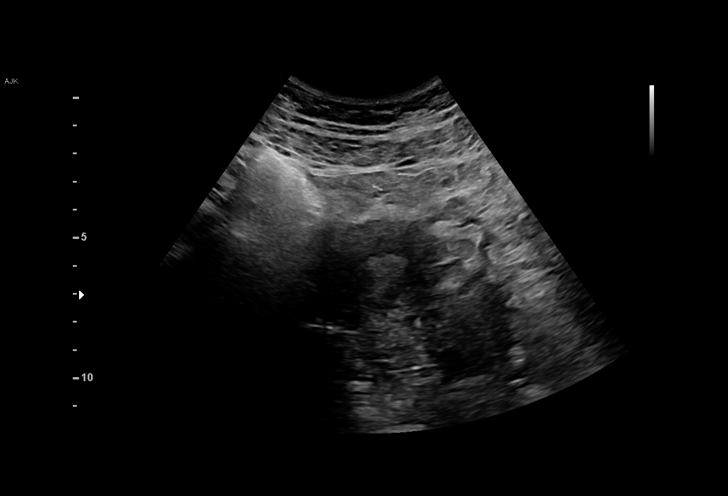
[im 11/58]
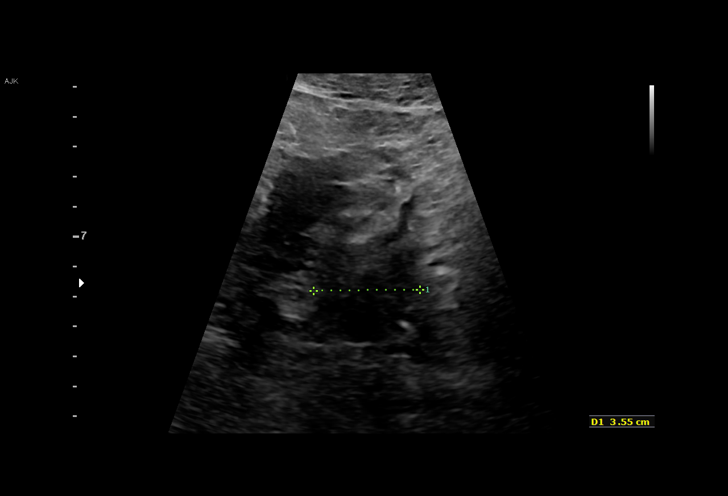
[im 15/58]
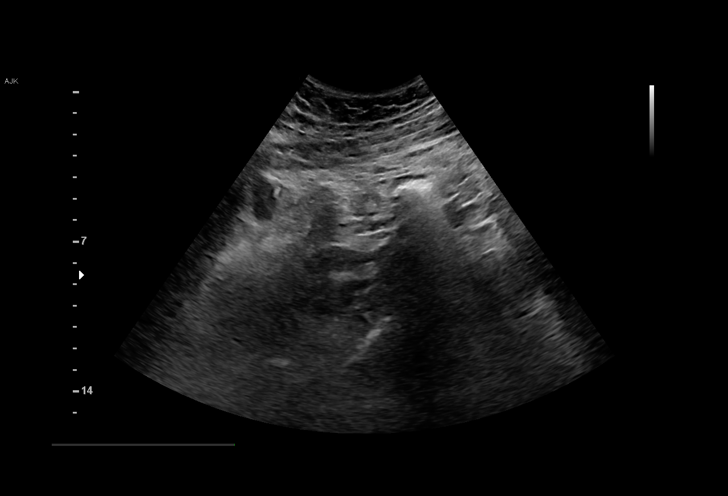
[im 20/58]
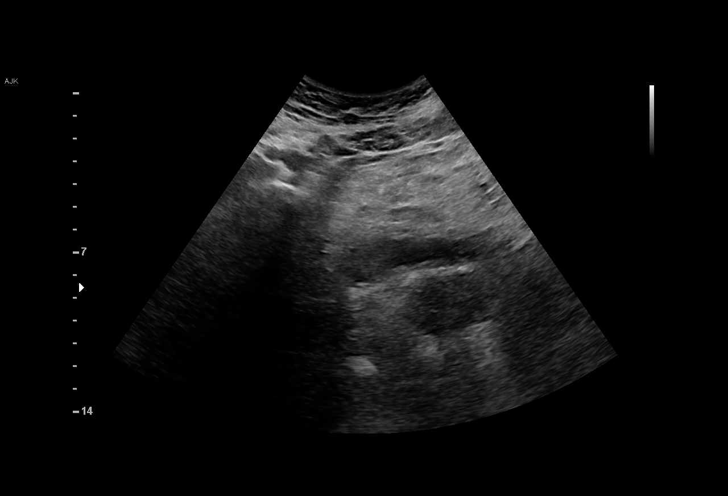
[im 24/58]
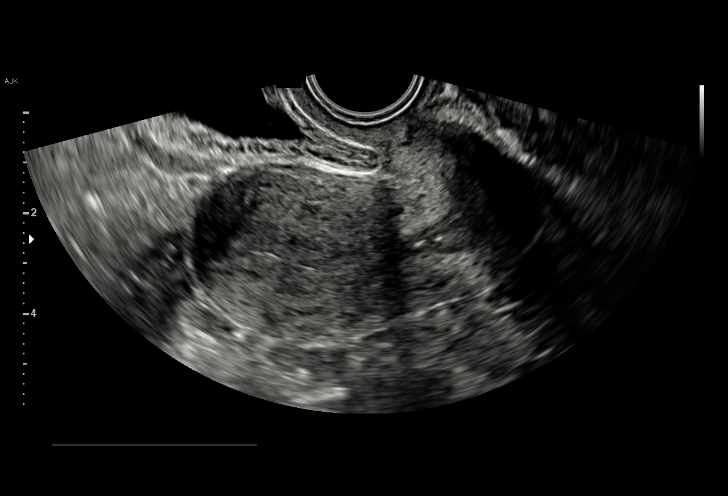
[im 28/58]
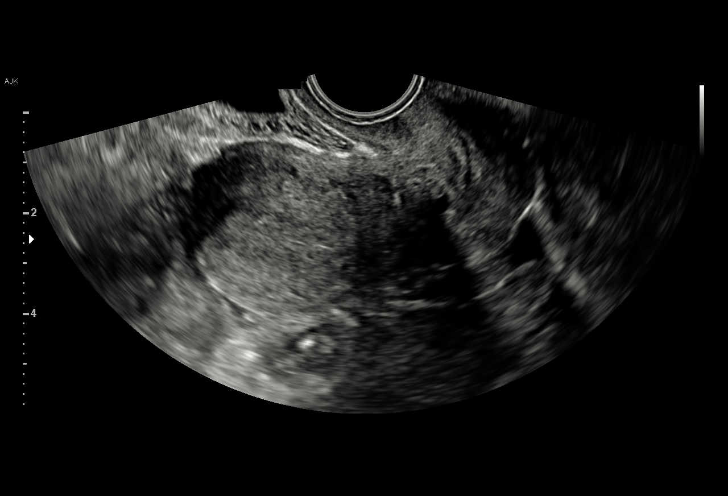
[im 32/58]
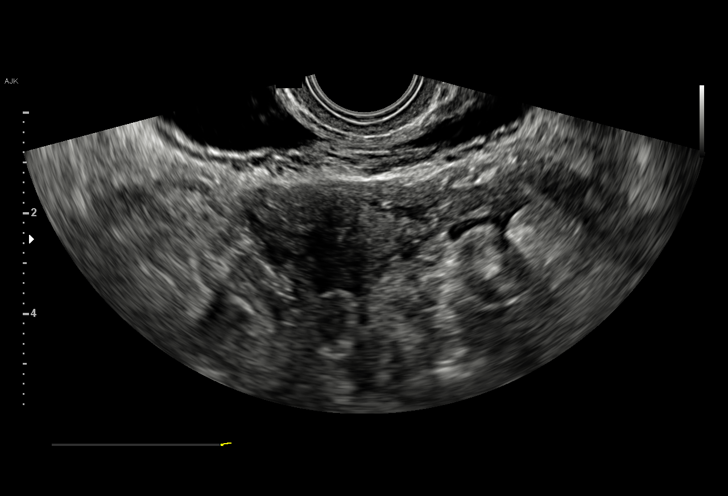
[im 36/58]
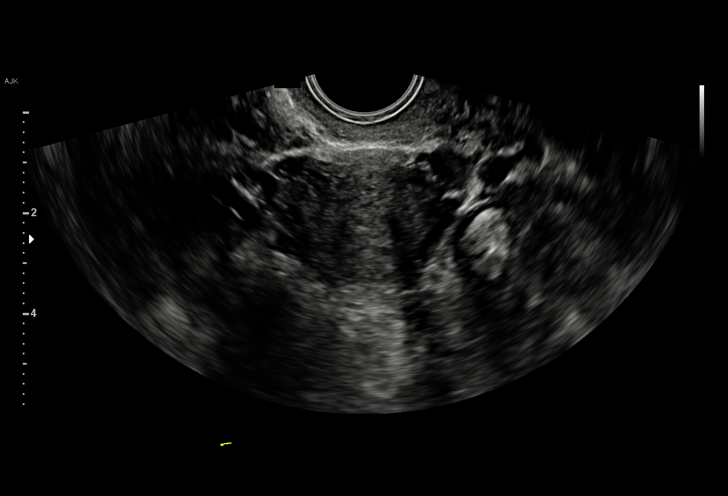
[im 41/58]
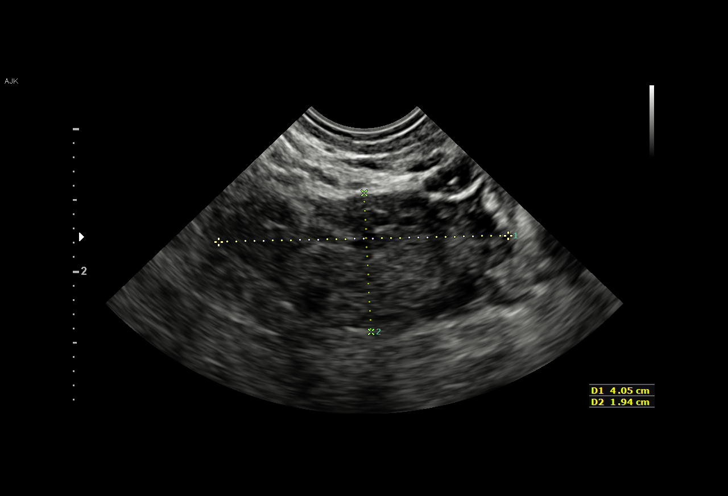
[im 45/58]
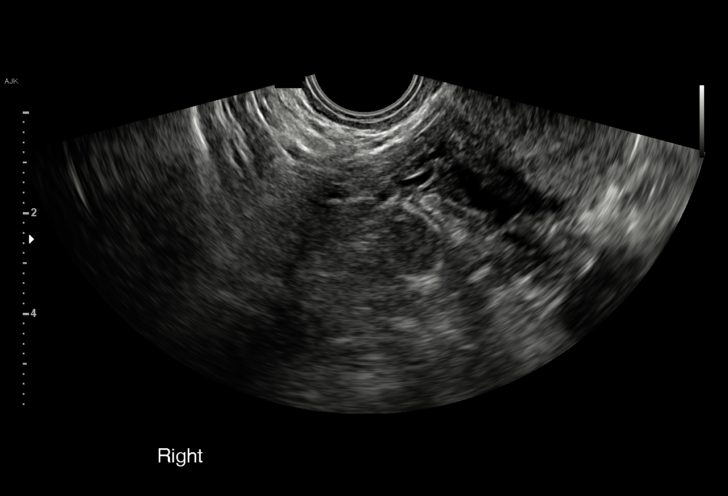
[im 49/58]
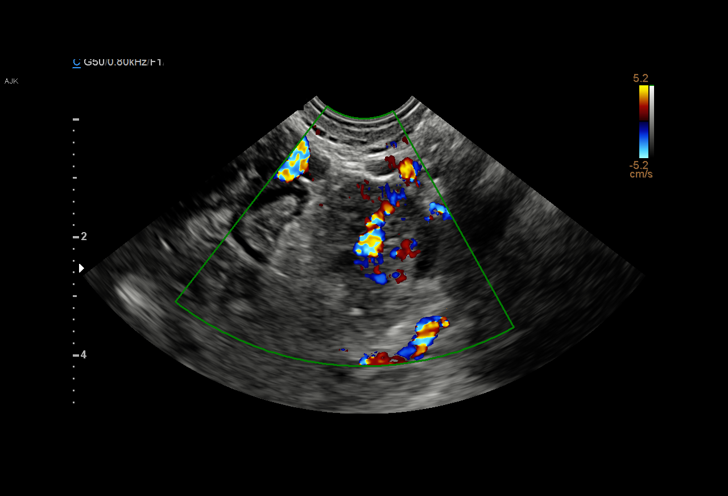
[im 53/58]
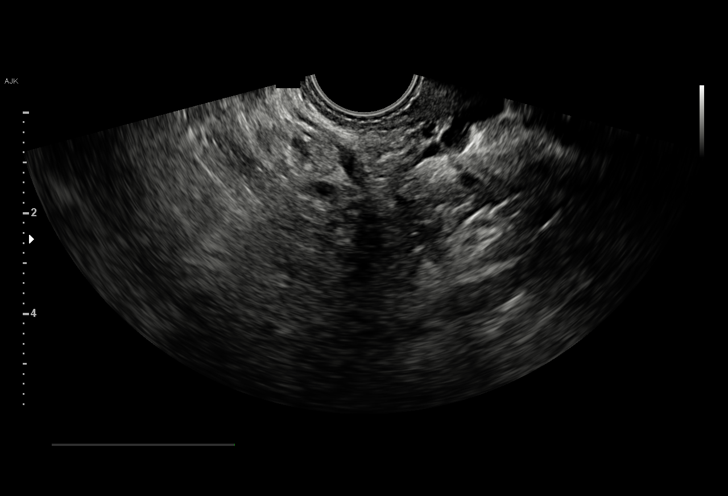
[im 58/58]
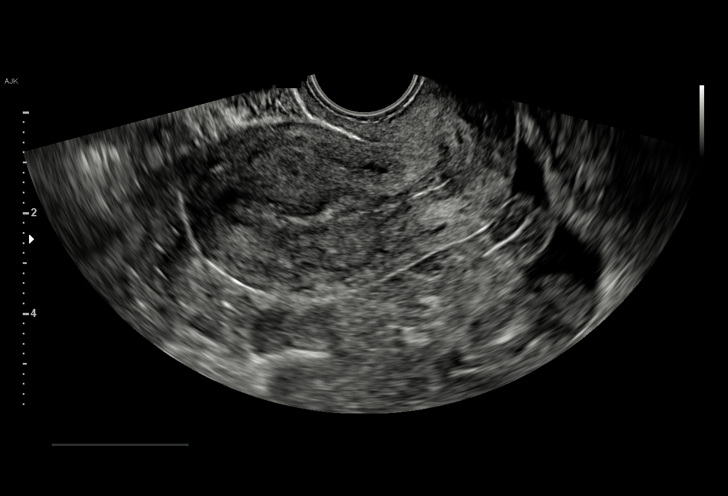

[14 of 28 positions shown; findings below may reference images not displayed]

FINDINGS: Intrauterine gestational sac: None

Maternal uterus/adnexae: Both ovaries are normal in appearance. No
masses identified. Small amount of simple free fluid noted in pelvic
cul-de-sac.
IMPRESSION: Pregnancy of unknown anatomic location (no intrauterine gestational
sac or adnexal mass identified). Differential diagnosis includes
recent spontaneous abortion, IUP too early to visualize, and
non-visualized ectopic pregnancy. Recommend correlation with serial
beta-hCG levels, and follow up US if warranted clinically.
FINDINGS: Gallbladder:

No gallstones or wall thickening visualized. No sonographic Murphy
sign noted. A mural soft tissue polyp is seen along the nondependent
wall near the fundus which measures 8 mm.

Common bile duct:

Diameter: 3 mm, within normal limits.

Liver:

No focal lesion identified. Within normal limits in parenchymal
echogenicity.

IVC:

No abnormality visualized.

Pancreas:

Visualized portion unremarkable.

Spleen:

Size and appearance within normal limits.

Right Kidney:

Length: 10.8 cm. Echogenicity within normal limits. No mass or
hydronephrosis visualized.

Left Kidney:

Length: 11.7 cm. Echogenicity within normal limits. No mass or
hydronephrosis visualized.

Abdominal aorta:

No aneurysm visualized.

Other findings:

None.
IMPRESSION: No acute findings.

8 mm gallbladder polyp. Continued follow-up of this finding is
recommended by ultrasound in 1 year. This recommendation follows ACR
consensus guidelines: White Paper of the ACR Incidental Findings
Committee II on Gallbladder and Biliary Findings. [HOSPITAL]
4057:;[DATE].

*** End of Addendum ***
FINDINGS: Intrauterine gestational sac: None

Maternal uterus/adnexae: Both ovaries are normal in appearance. No
masses identified. Small amount of simple free fluid noted in pelvic
cul-de-sac.
IMPRESSION: Pregnancy of unknown anatomic location (no intrauterine gestational
sac or adnexal mass identified). Differential diagnosis includes
recent spontaneous abortion, IUP too early to visualize, and
non-visualized ectopic pregnancy. Recommend correlation with serial
beta-hCG levels, and follow up US if warranted clinically.

## 2021-01-11 IMAGING — US US ABDOMEN COMPLETE
2 series · 14 of 25 positions shown · non-contrast
Comparison: None.
COMPARISON: None.
COMPARISON: None.

Addendum:
CLINICAL DATA: Abdominal pain in 1st trimester pregnancy. Unknown
LMP.

EXAM:
OBSTETRIC <14 WK US AND TRANSVAGINAL OB US
TECHNIQUE: Both transabdominal and transvaginal ultrasound examinations were
performed for complete evaluation of the gestation as well as the
maternal uterus, adnexal regions, and pelvic cul-de-sac.
Transvaginal technique was performed to assess early pregnancy.
CLINICAL DATA: Left upper quadrant abdominal pain and tenderness.
ULTRASOUND ABDOMEN COMPLETE

[Series 1: us abdomen complete · 7 of 107 slices shown (1 of 2)]
[im 1/107]
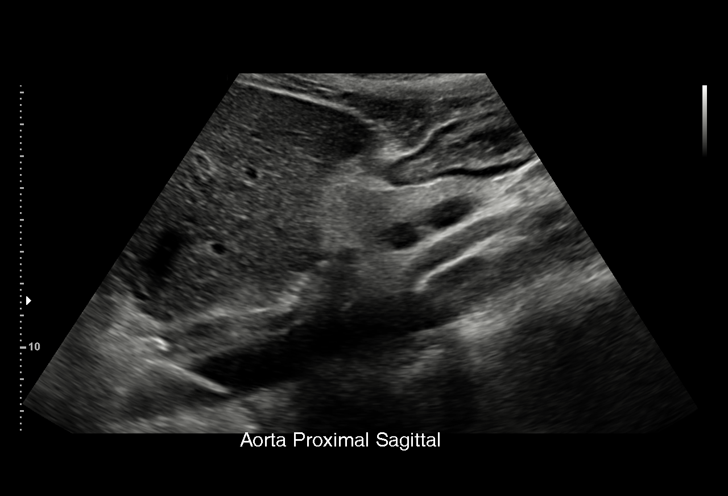
[im 18/107]
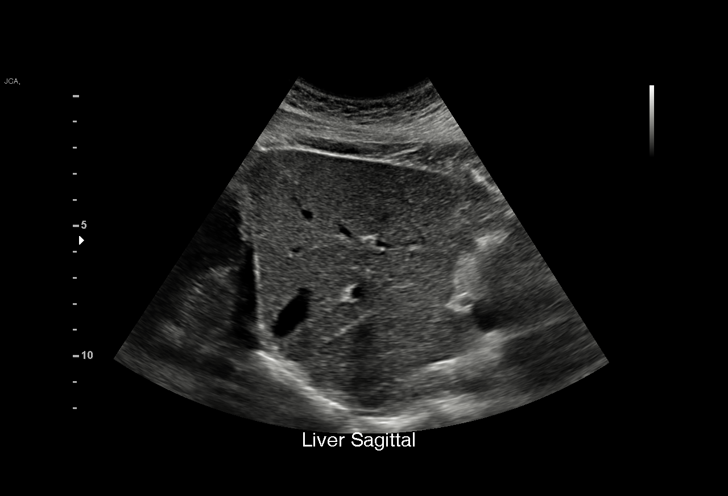
[im 36/107]
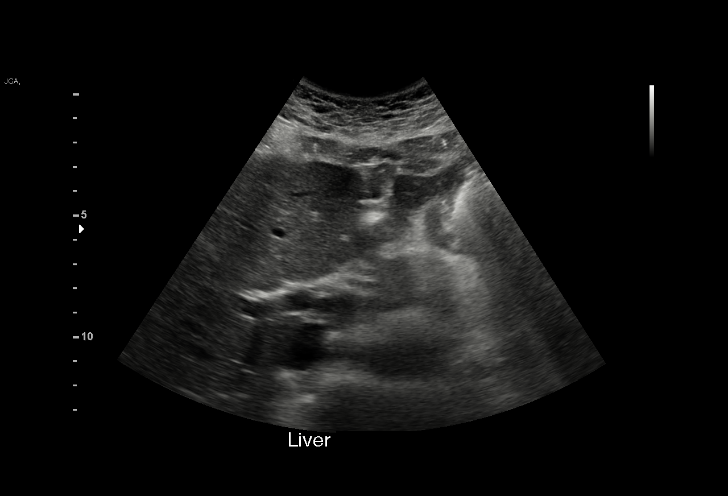
[im 54/107]
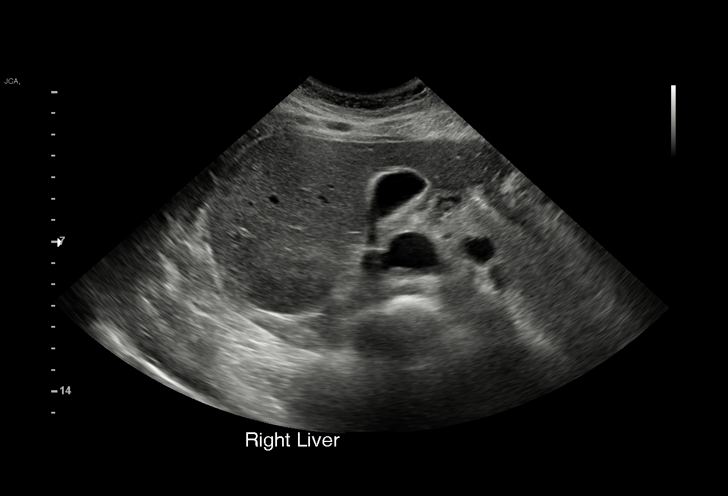
[im 71/107]
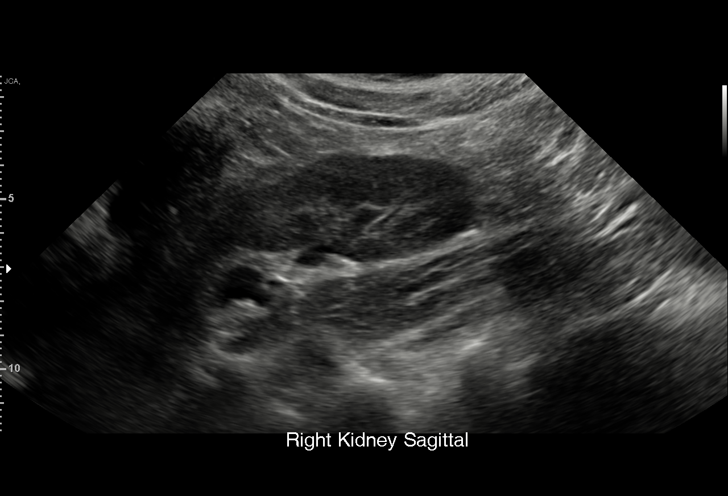
[im 80/107]
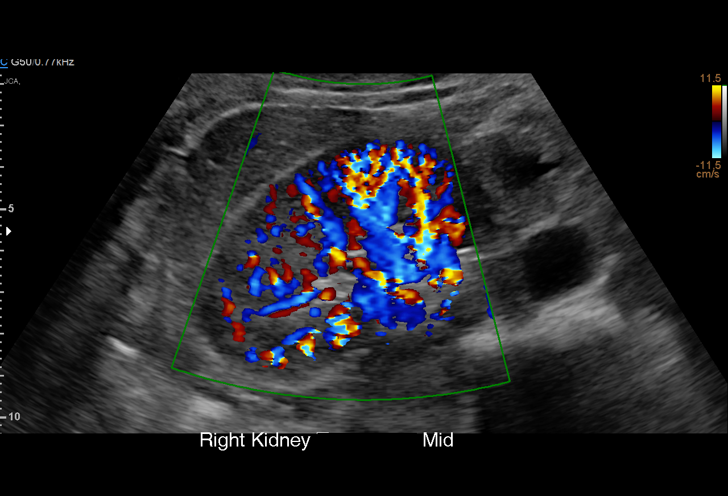
[im 98/107]
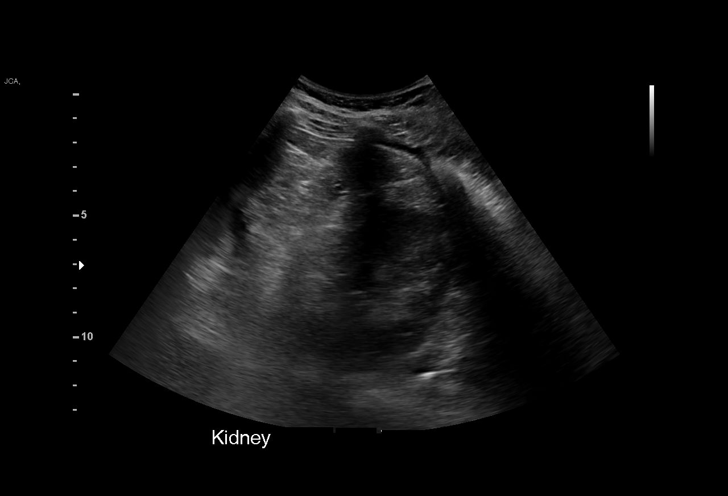

[Series 2: us abdomen complete · 7 of 96 slices shown (2 of 2)]
[im 1/96]
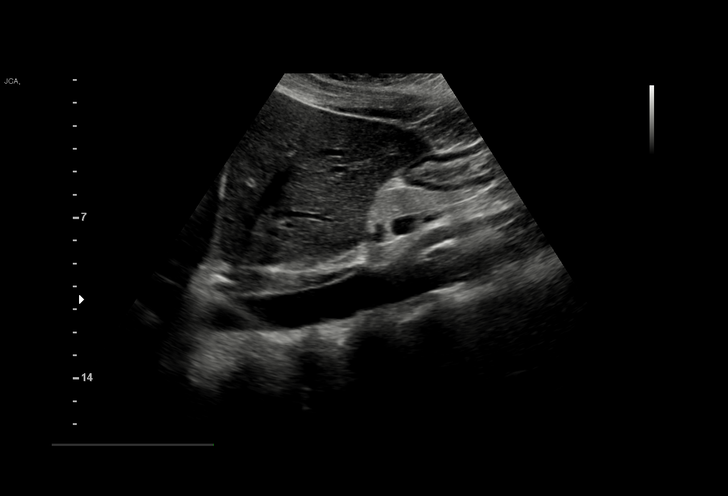
[im 18/96]
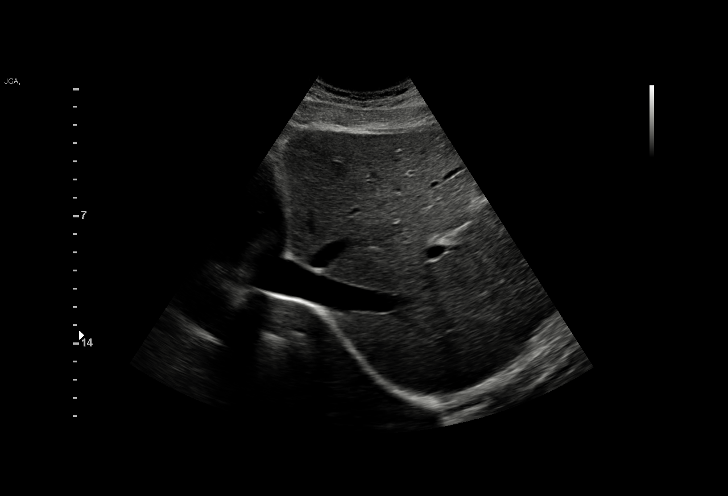
[im 26/96]
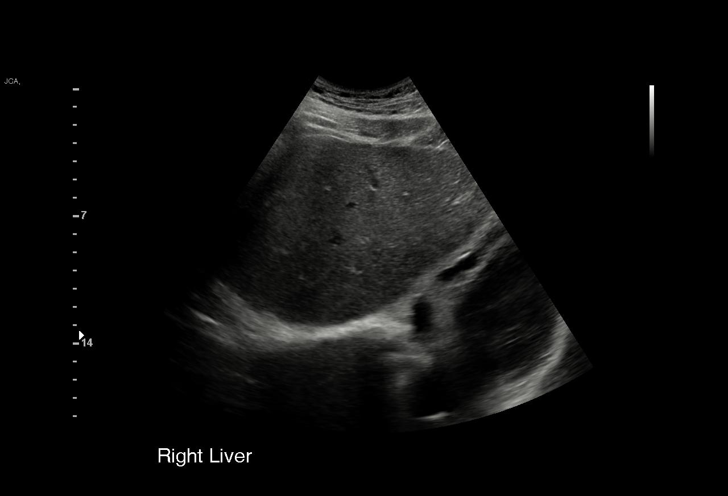
[im 44/96]
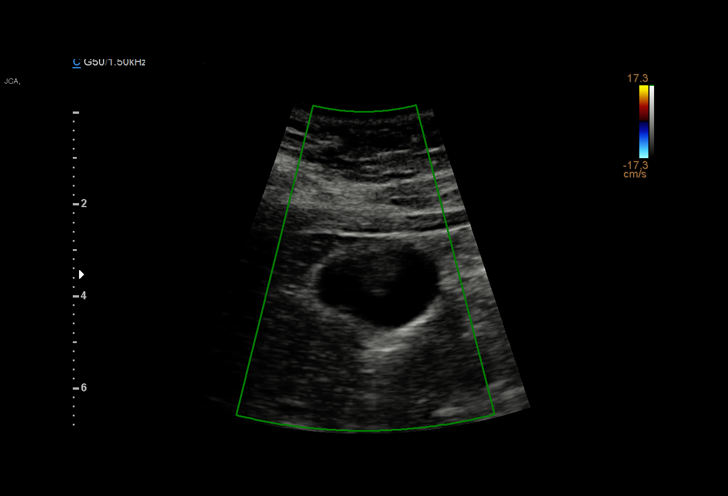
[im 61/96]
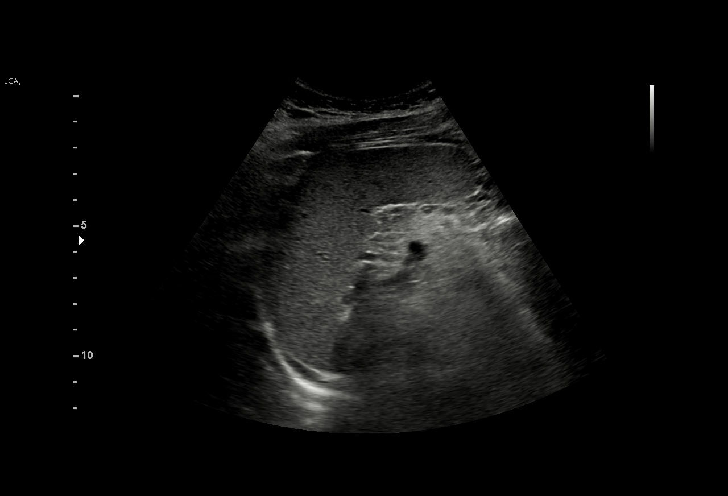
[im 78/96]
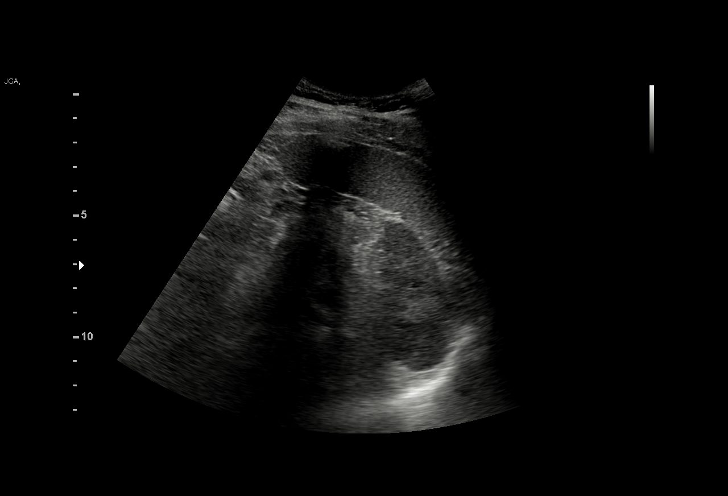
[im 96/96]
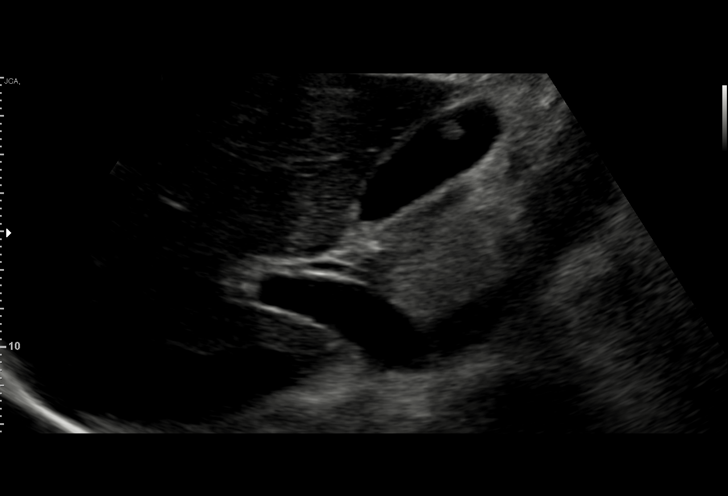

[14 of 25 positions shown; findings below may reference images not displayed]

FINDINGS: Intrauterine gestational sac: None

Maternal uterus/adnexae: Both ovaries are normal in appearance. No
masses identified. Small amount of simple free fluid noted in pelvic
cul-de-sac.
IMPRESSION: Pregnancy of unknown anatomic location (no intrauterine gestational
sac or adnexal mass identified). Differential diagnosis includes
recent spontaneous abortion, IUP too early to visualize, and
non-visualized ectopic pregnancy. Recommend correlation with serial
beta-hCG levels, and follow up US if warranted clinically.
FINDINGS: Gallbladder:

No gallstones or wall thickening visualized. No sonographic Murphy
sign noted. A mural soft tissue polyp is seen along the nondependent
wall near the fundus which measures 8 mm.

Common bile duct:

Diameter: 3 mm, within normal limits.

Liver:

No focal lesion identified. Within normal limits in parenchymal
echogenicity.

IVC:

No abnormality visualized.

Pancreas:

Visualized portion unremarkable.

Spleen:

Size and appearance within normal limits.

Right Kidney:

Length: 10.8 cm. Echogenicity within normal limits. No mass or
hydronephrosis visualized.

Left Kidney:

Length: 11.7 cm. Echogenicity within normal limits. No mass or
hydronephrosis visualized.

Abdominal aorta:

No aneurysm visualized.

Other findings:

None.
IMPRESSION: No acute findings.

8 mm gallbladder polyp. Continued follow-up of this finding is
recommended by ultrasound in 1 year. This recommendation follows ACR
consensus guidelines: White Paper of the ACR Incidental Findings
Committee II on Gallbladder and Biliary Findings. [HOSPITAL]
4057:;[DATE].

*** End of Addendum ***
FINDINGS: Intrauterine gestational sac: None

Maternal uterus/adnexae: Both ovaries are normal in appearance. No
masses identified. Small amount of simple free fluid noted in pelvic
cul-de-sac.
IMPRESSION: Pregnancy of unknown anatomic location (no intrauterine gestational
sac or adnexal mass identified). Differential diagnosis includes
recent spontaneous abortion, IUP too early to visualize, and
non-visualized ectopic pregnancy. Recommend correlation with serial
beta-hCG levels, and follow up US if warranted clinically.

## 2023-12-17 ENCOUNTER — Emergency Department (HOSPITAL_COMMUNITY)

## 2023-12-17 ENCOUNTER — Encounter (HOSPITAL_COMMUNITY): Payer: Self-pay | Admitting: Emergency Medicine

## 2023-12-17 ENCOUNTER — Other Ambulatory Visit (HOSPITAL_COMMUNITY)

## 2023-12-17 ENCOUNTER — Inpatient Hospital Stay (HOSPITAL_COMMUNITY)
Admission: EM | Admit: 2023-12-17 | Discharge: 2023-12-19 | DRG: 392 | Disposition: A | Discharge status: Home / Self Care | Attending: Internal Medicine | Admitting: Internal Medicine

## 2023-12-17 ENCOUNTER — Other Ambulatory Visit: Payer: Self-pay

## 2023-12-17 DIAGNOSIS — A084 Viral intestinal infection, unspecified: Principal | ICD-10-CM | POA: Diagnosis present

## 2023-12-17 DIAGNOSIS — E1110 Type 2 diabetes mellitus with ketoacidosis without coma: Principal | ICD-10-CM | POA: Diagnosis present

## 2023-12-17 DIAGNOSIS — Z818 Family history of other mental and behavioral disorders: Secondary | ICD-10-CM

## 2023-12-17 DIAGNOSIS — R739 Hyperglycemia, unspecified: Secondary | ICD-10-CM | POA: Diagnosis present

## 2023-12-17 DIAGNOSIS — G43909 Migraine, unspecified, not intractable, without status migrainosus: Secondary | ICD-10-CM | POA: Insufficient documentation

## 2023-12-17 DIAGNOSIS — Z832 Family history of diseases of the blood and blood-forming organs and certain disorders involving the immune mechanism: Secondary | ICD-10-CM

## 2023-12-17 DIAGNOSIS — R112 Nausea with vomiting, unspecified: Secondary | ICD-10-CM | POA: Insufficient documentation

## 2023-12-17 DIAGNOSIS — R519 Headache, unspecified: Secondary | ICD-10-CM | POA: Insufficient documentation

## 2023-12-17 DIAGNOSIS — Z9049 Acquired absence of other specified parts of digestive tract: Secondary | ICD-10-CM

## 2023-12-17 DIAGNOSIS — R109 Unspecified abdominal pain: Secondary | ICD-10-CM | POA: Insufficient documentation

## 2023-12-17 DIAGNOSIS — E872 Acidosis, unspecified: Secondary | ICD-10-CM | POA: Diagnosis present

## 2023-12-17 DIAGNOSIS — Z8679 Personal history of other diseases of the circulatory system: Secondary | ICD-10-CM

## 2023-12-17 DIAGNOSIS — K529 Noninfective gastroenteritis and colitis, unspecified: Secondary | ICD-10-CM | POA: Diagnosis present

## 2023-12-17 DIAGNOSIS — R001 Bradycardia, unspecified: Secondary | ICD-10-CM | POA: Insufficient documentation

## 2023-12-17 DIAGNOSIS — Z6832 Body mass index (BMI) 32.0-32.9, adult: Secondary | ICD-10-CM

## 2023-12-17 DIAGNOSIS — E669 Obesity, unspecified: Secondary | ICD-10-CM | POA: Diagnosis present

## 2023-12-17 DIAGNOSIS — Z87891 Personal history of nicotine dependence: Secondary | ICD-10-CM

## 2023-12-17 DIAGNOSIS — Z91040 Latex allergy status: Secondary | ICD-10-CM

## 2023-12-17 LAB — COMPREHENSIVE METABOLIC PANEL
ALT: 19 U/L (ref 0–44)
AST: 33 U/L (ref 15–41)
Albumin: 4.2 g/dL (ref 3.5–5.0)
Alkaline Phosphatase: 59 U/L (ref 38–126)
Anion gap: 19 — ABNORMAL HIGH (ref 5–15)
BUN: 9 mg/dL (ref 6–20)
CO2: 16 mmol/L — ABNORMAL LOW (ref 22–32)
Calcium: 9.3 mg/dL (ref 8.9–10.3)
Chloride: 102 mmol/L (ref 98–111)
Creatinine, Ser: 0.94 mg/dL (ref 0.44–1.00)
GFR, Estimated: >60 mL/min (ref >60)
Glucose, Bld: 222 mg/dL — ABNORMAL HIGH (ref 70–99)
Potassium: 3.8 mmol/L (ref 3.5–5.1)
Sodium: 137 mmol/L (ref 135–145)
Total Bilirubin: 0.9 mg/dL (ref 0.0–1.2)
Total Protein: 7.3 g/dL (ref 6.5–8.1)

## 2023-12-17 LAB — CBC
HCT: 39.9 % (ref 36.0–46.0)
Hemoglobin: 13.4 g/dL (ref 12.0–15.0)
MCH: 30.5 pg (ref 26.0–34.0)
MCHC: 33.6 g/dL (ref 30.0–36.0)
MCV: 90.7 fL (ref 80.0–100.0)
Platelets: 270 10*3/uL (ref 150–400)
RBC: 4.4 MIL/uL (ref 3.87–5.11)
RDW: 13.2 % (ref 11.5–15.5)
WBC: 16.3 10*3/uL — ABNORMAL HIGH (ref 4.0–10.5)
nRBC: 0 % (ref 0.0–0.2)

## 2023-12-17 LAB — LIPASE, BLOOD: Lipase: 32 U/L (ref 11–51)

## 2023-12-17 LAB — HCG, SERUM, QUALITATIVE: Preg, Serum: NEGATIVE

## 2023-12-17 MED ORDER — ONDANSETRON HCL 4 MG/2ML IJ SOLN
4.0000 mg | Freq: Once | INTRAMUSCULAR | Status: AC
  Administered 2023-12-17: 4 mg via INTRAVENOUS
  Filled 2023-12-17: qty 2

## 2023-12-17 MED ORDER — LACTATED RINGERS IV BOLUS
1000.0000 mL | Freq: Once | INTRAVENOUS | Status: AC
  Administered 2023-12-17: 1000 mL via INTRAVENOUS

## 2023-12-17 MED ORDER — MORPHINE SULFATE (PF) 4 MG/ML IV SOLN
4.0000 mg | Freq: Once | INTRAVENOUS | Status: AC
  Administered 2023-12-17: 4 mg via INTRAVENOUS
  Filled 2023-12-17: qty 1

## 2023-12-17 MED ORDER — LACTATED RINGERS IV BOLUS
1000.0000 mL | Freq: Once | INTRAVENOUS | Status: AC
  Administered 2023-12-18: 1000 mL via INTRAVENOUS

## 2023-12-17 MED ORDER — INSULIN ASPART 100 UNIT/ML IJ SOLN
5.0000 [IU] | Freq: Once | INTRAMUSCULAR | Status: AC
  Administered 2023-12-18: 5 [IU] via INTRAVENOUS

## 2023-12-17 NOTE — ED Provider Notes (Signed)
 [facility] Provider Note   CSN: [id number] Arrival date & time: 12/17/23  2200     History {Add pertinent medical, surgical, social history, OB history to HPI:1} Chief Complaint  Patient presents with   Abdominal Pain    Marilyn Richardson is a 28 y.o. female.  Level 5 caveat for acuity of condition.  Patient here with upper abdominal pain that onset this morning associated with nausea and vomiting and dry heaving.  The pain is constant but waxes and wanes in severity, never goes away completely.  Her pain is left upper quadrant and epigastric.  Denies fevers but has had chills and felt shaky.  Denies pain with urination or blood in the urine.  No vaginal bleeding or discharge.  No chest pain or shortness of breath.  Feels cold and clammy and shaky all over.  He has a history of acid reflux but this feels different.  Previous cholecystectomy.  Also complains of dull headache.  She is concerned because she has a history of an aneurysm that was never repaired.  Denies thunderclap onset headache.  States gradual in onset.  Pain is worse to palpation but feels better when she puts her hand on the her abdomen.  The history is provided by the patient. The history is limited by the condition of the patient.  Abdominal Pain Associated symptoms: fatigue, nausea and vomiting   Associated symptoms: no chest pain, no cough, no dysuria, no fever, no hematuria and no shortness of breath        Home Medications Prior to Admission medications   Medication Sig Start Date End Date Taking? Authorizing Provider  acetaminophen (TYLENOL) 500 MG tablet Take 500 mg by mouth every 6 (six) hours as needed for mild pain or headache.    [provider]  ibuprofen (ADVIL) 600 MG tablet Take 1 tablet (600 mg total) by mouth every 6 (six) hours. 05/20/20   Morris, Aundra Millet, DO  oxyCODONE (OXY IR/ROXICODONE) 5 MG immediate release tablet Take 1 tablet (5 mg total) by  mouth every 4 (four) hours as needed (pain scale 4-7). 05/20/20   Mitchel Honour, DO  Prenatal Vit-Fe Fumarate-FA (PRENATAL PO) Take 1 tablet by mouth daily.    [provider]  senna-docusate (SENOKOT-S) 8.6-50 MG tablet Take 2 tablets by mouth daily. 05/21/20   Mitchel Honour, DO      Allergies    Latex and Other    Review of Systems   Review of Systems  Constitutional:  Positive for activity change, appetite change and fatigue. Negative for fever.  HENT:  Negative for congestion and rhinorrhea.   Respiratory:  Negative for cough, chest tightness and shortness of breath.   Cardiovascular:  Negative for chest pain.  Gastrointestinal:  Positive for abdominal pain, nausea and vomiting.  Genitourinary:  Negative for dysuria and hematuria. Dyspareunia: .ro. Musculoskeletal:  Negative for arthralgias and myalgias.  Skin:  Negative for rash.  Neurological:  Positive for weakness and headaches. Negative for dizziness (.ro).   all other systems are negative except as noted in the HPI and PMH.    Physical Exam Updated Vital Signs BP 127/83 (BP Location: Left Arm)   Pulse 81   Temp 97.6 F (36.4 C) (Axillary)   Resp 19   Ht 5\' 5"  (1.651 m)   Wt 88.5 kg   SpO2 100%   BMI 32.45 kg/m  Physical Exam Vitals and nursing note reviewed.  Constitutional:  General: She is not in acute distress.    Appearance: She is well-developed.     Comments: Pale appearing, diaphoretic  HENT:     Head: Normocephalic and atraumatic.     Mouth/Throat:     Pharynx: No oropharyngeal exudate.  Eyes:     Conjunctiva/sclera: Conjunctivae normal.     Pupils: Pupils are equal, round, and reactive to light.  Neck:     Comments: No meningismus. Cardiovascular:     Rate and Rhythm: Normal rate and regular rhythm.     Heart sounds: Normal heart sounds. No murmur heard. Pulmonary:     Effort: Pulmonary effort is normal. No respiratory distress.     Breath sounds: Normal breath sounds.  Abdominal:      Palpations: Abdomen is soft.     Tenderness: There is abdominal tenderness. There is guarding. There is no rebound.     Comments: Tender epigastrium and left upper quadrant with voluntary guarding.  No lower abdominal tenderness  Musculoskeletal:        General: No tenderness. Normal range of motion.     Cervical back: Normal range of motion and neck supple.  Skin:    General: Skin is warm.  Neurological:     Mental Status: She is alert and oriented to person, place, and time.     Cranial Nerves: No cranial nerve deficit.     Motor: No abnormal muscle tone.     Coordination: Coordination normal.     Comments:  5/5 strength throughout. CN 2-12 intact.Equal grip strength.   Psychiatric:        Behavior: Behavior normal.     ED Results / Procedures / Treatments   Labs (all labs ordered are listed, but only abnormal results are displayed) Labs Reviewed  COMPREHENSIVE METABOLIC PANEL - Abnormal; Notable for the following components:      Result Value   CO2 16 (*)    Glucose, Bld 222 (*)    Anion gap 19 (*)    All other components within normal limits  CBC - Abnormal; Notable for the following components:   WBC 16.3 (*)    All other components within normal limits  LIPASE, BLOOD  HCG, SERUM, QUALITATIVE  URINALYSIS, ROUTINE W REFLEX MICROSCOPIC  LACTIC ACID, PLASMA  LACTIC ACID, PLASMA  PREGNANCY, URINE  I-STAT VENOUS BLOOD GAS, ED  TROPONIN I (HIGH SENSITIVITY)    EKG None  Radiology No results found.  Procedures Procedures  {Document cardiac monitor, telemetry assessment procedure when appropriate:1}  Medications Ordered in ED Medications  lactated ringers bolus 1,000 mL (has no administration in time range)  ondansetron (ZOFRAN) injection 4 mg (has no administration in time range)  morphine (PF) 4 MG/ML injection 4 mg (has no administration in time range)  ondansetron (ZOFRAN) injection 4 mg (4 mg Intravenous Given 12/17/23 2243)  lactated ringers bolus 1,000 mL  (1,000 mLs Intravenous New Bag/Given 12/17/23 2245)    ED Course/ Medical Decision Making/ A&P   {   Click here for ABCD2, HEART and other calculatorsREFRESH Note before signing :1}                              Medical Decision Making Amount and/or Complexity of Data Reviewed Labs: ordered. Decision-making details documented in ED Course. Radiology: ordered and independent interpretation performed. Decision-making details documented in ED Course. ECG/medicine tests: ordered and independent interpretation performed. Decision-making details documented in ED Course.  Risk Prescription  drug management.   Upper abdominal pain with nausea and vomiting.  No fever but has felt clammy.  Stable vitals but she appears uncomfortable, pale and diaphoretic.  Abdomen soft without peritoneal signs.  An IV fluids, pain and nausea control.  Labs obtained in triage are significant for leukocytosis, hyperglycemia and anion gap of 19.  No history of diabetes.  Concern for possibly new onset DKA.  Will hydrate and give insulin.  Add lactate, venous blood gas.  {Document critical care time when appropriate:1} {Document review of labs and clinical decision tools ie heart score, Chads2Vasc2 etc:1}  {Document your independent review of radiology images, and any outside records:1} {Document your discussion with family members, caretakers, and with consultants:1} {Document social determinants of health affecting pt's care:1} {Document your decision making why or why not admission, treatments were needed:1} Final Clinical Impression(s) / ED Diagnoses Final diagnoses:  None    Rx / DC Orders ED Discharge Orders     None

## 2023-12-17 NOTE — ED Triage Notes (Signed)
 Pt BIB EMS from home for sudden onset LUQ abd pain with nausea and vomiting, reports relief when she hold pressure on her abd. Given 4mg  of Zofran by EMS. Pt pale and diaphoretic in triage.  CBG 174 130/90 HR 90 100% RA

## 2023-12-18 ENCOUNTER — Emergency Department (HOSPITAL_COMMUNITY)

## 2023-12-18 ENCOUNTER — Encounter (HOSPITAL_COMMUNITY): Payer: Self-pay | Admitting: Internal Medicine

## 2023-12-18 ENCOUNTER — Inpatient Hospital Stay (HOSPITAL_COMMUNITY)

## 2023-12-18 DIAGNOSIS — R519 Headache, unspecified: Secondary | ICD-10-CM | POA: Diagnosis not present

## 2023-12-18 DIAGNOSIS — A084 Viral intestinal infection, unspecified: Secondary | ICD-10-CM | POA: Diagnosis present

## 2023-12-18 DIAGNOSIS — Z818 Family history of other mental and behavioral disorders: Secondary | ICD-10-CM | POA: Diagnosis not present

## 2023-12-18 DIAGNOSIS — E872 Acidosis, unspecified: Secondary | ICD-10-CM | POA: Diagnosis present

## 2023-12-18 DIAGNOSIS — R197 Diarrhea, unspecified: Secondary | ICD-10-CM

## 2023-12-18 DIAGNOSIS — R109 Unspecified abdominal pain: Secondary | ICD-10-CM | POA: Insufficient documentation

## 2023-12-18 DIAGNOSIS — Z832 Family history of diseases of the blood and blood-forming organs and certain disorders involving the immune mechanism: Secondary | ICD-10-CM | POA: Diagnosis not present

## 2023-12-18 DIAGNOSIS — E1110 Type 2 diabetes mellitus with ketoacidosis without coma: Secondary | ICD-10-CM | POA: Diagnosis present

## 2023-12-18 DIAGNOSIS — R001 Bradycardia, unspecified: Secondary | ICD-10-CM | POA: Insufficient documentation

## 2023-12-18 DIAGNOSIS — Z87891 Personal history of nicotine dependence: Secondary | ICD-10-CM | POA: Diagnosis not present

## 2023-12-18 DIAGNOSIS — Z6832 Body mass index (BMI) 32.0-32.9, adult: Secondary | ICD-10-CM | POA: Diagnosis not present

## 2023-12-18 DIAGNOSIS — G43909 Migraine, unspecified, not intractable, without status migrainosus: Secondary | ICD-10-CM

## 2023-12-18 DIAGNOSIS — K529 Noninfective gastroenteritis and colitis, unspecified: Secondary | ICD-10-CM | POA: Diagnosis present

## 2023-12-18 DIAGNOSIS — R112 Nausea with vomiting, unspecified: Secondary | ICD-10-CM | POA: Diagnosis not present

## 2023-12-18 DIAGNOSIS — Z8679 Personal history of other diseases of the circulatory system: Secondary | ICD-10-CM

## 2023-12-18 DIAGNOSIS — R739 Hyperglycemia, unspecified: Secondary | ICD-10-CM | POA: Diagnosis present

## 2023-12-18 DIAGNOSIS — Z9049 Acquired absence of other specified parts of digestive tract: Secondary | ICD-10-CM | POA: Diagnosis not present

## 2023-12-18 DIAGNOSIS — E669 Obesity, unspecified: Secondary | ICD-10-CM | POA: Diagnosis present

## 2023-12-18 DIAGNOSIS — Z91040 Latex allergy status: Secondary | ICD-10-CM | POA: Diagnosis not present

## 2023-12-18 LAB — TSH: TSH: 0.892 u[IU]/mL (ref 0.350–4.500)

## 2023-12-18 LAB — BASIC METABOLIC PANEL
Anion gap: 13 (ref 5–15)
Anion gap: 14 (ref 5–15)
BUN: <5 mg/dL — ABNORMAL LOW (ref 6–20)
BUN: <5 mg/dL — ABNORMAL LOW (ref 6–20)
CO2: 18 mmol/L — ABNORMAL LOW (ref 22–32)
CO2: 18 mmol/L — ABNORMAL LOW (ref 22–32)
Calcium: 8.9 mg/dL (ref 8.9–10.3)
Calcium: 8.1 mg/dL — ABNORMAL LOW (ref 8.9–10.3)
Chloride: 105 mmol/L (ref 98–111)
Chloride: 105 mmol/L (ref 98–111)
Creatinine, Ser: 0.75 mg/dL (ref 0.44–1.00)
Creatinine, Ser: 0.61 mg/dL (ref 0.44–1.00)
GFR, Estimated: >60 mL/min (ref >60)
GFR, Estimated: >60 mL/min (ref >60)
Glucose, Bld: 174 mg/dL — ABNORMAL HIGH (ref 70–99)
Glucose, Bld: 168 mg/dL — ABNORMAL HIGH (ref 70–99)
Potassium: 3.6 mmol/L (ref 3.5–5.1)
Potassium: 3.5 mmol/L (ref 3.5–5.1)
Sodium: 136 mmol/L (ref 135–145)
Sodium: 137 mmol/L (ref 135–145)

## 2023-12-18 LAB — URINALYSIS, ROUTINE W REFLEX MICROSCOPIC
Bacteria, UA: NONE SEEN
Bilirubin Urine: NEGATIVE
Glucose, UA: >=500 mg/dL — AB
Hgb urine dipstick: NEGATIVE
Ketones, ur: 80 mg/dL — AB
Leukocytes,Ua: NEGATIVE
Nitrite: NEGATIVE
Protein, ur: NEGATIVE mg/dL
Specific Gravity, Urine: 1.024 (ref 1.005–1.030)
pH: 7 (ref 5.0–8.0)

## 2023-12-18 LAB — CBG MONITORING, ED
Glucose-Capillary: 158 mg/dL — ABNORMAL HIGH (ref 70–99)
Glucose-Capillary: 156 mg/dL — ABNORMAL HIGH (ref 70–99)
Glucose-Capillary: 150 mg/dL — ABNORMAL HIGH (ref 70–99)
Glucose-Capillary: 157 mg/dL — ABNORMAL HIGH (ref 70–99)
Glucose-Capillary: 145 mg/dL — ABNORMAL HIGH (ref 70–99)

## 2023-12-18 LAB — I-STAT VENOUS BLOOD GAS, ED
Acid-base deficit: 3 mmol/L — ABNORMAL HIGH (ref 0.0–2.0)
Bicarbonate: 18.9 mmol/L — ABNORMAL LOW (ref 20.0–28.0)
Calcium, Ion: 1.01 mmol/L — ABNORMAL LOW (ref 1.15–1.40)
HCT: 38 % (ref 36.0–46.0)
Hemoglobin: 12.9 g/dL (ref 12.0–15.0)
O2 Saturation: 77 %
Potassium: 4.1 mmol/L (ref 3.5–5.1)
Sodium: 138 mmol/L (ref 135–145)
TCO2: 20 mmol/L — ABNORMAL LOW (ref 22–32)
pCO2, Ven: 25.3 mmHg — ABNORMAL LOW (ref 44–60)
pH, Ven: 7.483 — ABNORMAL HIGH (ref 7.25–7.43)
pO2, Ven: 38 mmHg (ref 32–45)

## 2023-12-18 LAB — TROPONIN I (HIGH SENSITIVITY)
Troponin I (High Sensitivity): <2 ng/L (ref <18)
Troponin I (High Sensitivity): <2 ng/L (ref <18)

## 2023-12-18 LAB — HEMOGLOBIN A1C
Hgb A1c MFr Bld: 5.4 % (ref 4.8–5.6)
Mean Plasma Glucose: 108.28 mg/dL

## 2023-12-18 LAB — LACTIC ACID, PLASMA
Lactic Acid, Venous: 3.2 mmol/L (ref 0.5–1.9)
Lactic Acid, Venous: 4.3 mmol/L (ref 0.5–1.9)

## 2023-12-18 LAB — BETA-HYDROXYBUTYRIC ACID
Beta-Hydroxybutyric Acid: 0.78 mmol/L — ABNORMAL HIGH (ref 0.05–0.27)
Beta-Hydroxybutyric Acid: 1.51 mmol/L — ABNORMAL HIGH (ref 0.05–0.27)

## 2023-12-18 LAB — PREGNANCY, URINE: Preg Test, Ur: NEGATIVE

## 2023-12-18 LAB — HIV ANTIBODY (ROUTINE TESTING W REFLEX): HIV Screen 4th Generation wRfx: NONREACTIVE

## 2023-12-18 MED ORDER — PANTOPRAZOLE SODIUM 40 MG IV SOLR
40.0000 mg | INTRAVENOUS | Status: DC
  Administered 2023-12-18 – 2023-12-19 (×2): 40 mg via INTRAVENOUS
  Filled 2023-12-18 (×2): qty 10

## 2023-12-18 MED ORDER — PIPERACILLIN-TAZOBACTAM 3.375 G IVPB 30 MIN
3.3750 g | Freq: Once | INTRAVENOUS | Status: AC
  Administered 2023-12-18: 3.375 g via INTRAVENOUS
  Filled 2023-12-18: qty 50

## 2023-12-18 MED ORDER — LACTATED RINGERS IV SOLN
INTRAVENOUS | Status: DC
Start: 2023-12-18 — End: 2023-12-18

## 2023-12-18 MED ORDER — PROCHLORPERAZINE EDISYLATE 10 MG/2ML IJ SOLN
10.0000 mg | Freq: Once | INTRAMUSCULAR | Status: AC
  Administered 2023-12-18: 10 mg via INTRAVENOUS
  Filled 2023-12-18: qty 2

## 2023-12-18 MED ORDER — PIPERACILLIN-TAZOBACTAM 3.375 G IVPB
3.3750 g | Freq: Three times a day (TID) | INTRAVENOUS | Status: DC
  Administered 2023-12-18 – 2023-12-19 (×3): 3.375 g via INTRAVENOUS
  Filled 2023-12-18 (×4): qty 50

## 2023-12-18 MED ORDER — INSULIN REGULAR(HUMAN) IN NACL 100-0.9 UT/100ML-% IV SOLN
INTRAVENOUS | Status: DC
  Administered 2023-12-18: 2.4 [IU]/h via INTRAVENOUS
  Filled 2023-12-18: qty 100

## 2023-12-18 MED ORDER — MORPHINE SULFATE (PF) 2 MG/ML IV SOLN: 2.0000 mg | INTRAVENOUS | Status: DC | PRN

## 2023-12-18 MED ORDER — LACTATED RINGERS IV BOLUS: 20.0000 mL/kg | Freq: Once | INTRAVENOUS | Status: DC

## 2023-12-18 MED ORDER — DEXTROSE IN LACTATED RINGERS 5 % IV SOLN: INTRAVENOUS | Status: DC

## 2023-12-18 MED ORDER — VALPROATE SODIUM 100 MG/ML IV SOLN
500.0000 mg | Freq: Once | INTRAVENOUS | Status: AC
  Administered 2023-12-18: 500 mg via INTRAVENOUS
  Filled 2023-12-18: qty 5

## 2023-12-18 MED ORDER — SODIUM CHLORIDE 0.9 % IV BOLUS
1000.0000 mL | Freq: Once | INTRAVENOUS | Status: AC
  Administered 2023-12-18: 1000 mL via INTRAVENOUS

## 2023-12-18 MED ORDER — DIPHENHYDRAMINE HCL 50 MG/ML IJ SOLN
25.0000 mg | Freq: Once | INTRAMUSCULAR | Status: AC
  Administered 2023-12-18: 25 mg via INTRAVENOUS
  Filled 2023-12-18: qty 1

## 2023-12-18 MED ORDER — INSULIN REGULAR(HUMAN) IN NACL 100-0.9 UT/100ML-% IV SOLN
INTRAVENOUS | Status: DC
  Administered 2023-12-18: 2.4 [IU]/h via INTRAVENOUS

## 2023-12-18 MED ORDER — OXYCODONE HCL 5 MG PO TABS
5.0000 mg | ORAL_TABLET | Freq: Four times a day (QID) | ORAL | Status: DC | PRN
  Administered 2023-12-19: 5 mg via ORAL
  Filled 2023-12-18 (×2): qty 1

## 2023-12-18 MED ORDER — MAGNESIUM SULFATE 2 GM/50ML IV SOLN
2.0000 g | Freq: Once | INTRAVENOUS | Status: AC
  Administered 2023-12-18: 2 g via INTRAVENOUS
  Filled 2023-12-18: qty 50

## 2023-12-18 MED ORDER — DEXTROSE 50 % IV SOLN: 0.0000 mL | INTRAVENOUS | Status: DC | PRN

## 2023-12-18 MED ORDER — LACTATED RINGERS IV SOLN: INTRAVENOUS | Status: DC

## 2023-12-18 MED ORDER — LACTATED RINGERS IV BOLUS
1000.0000 mL | Freq: Once | INTRAVENOUS | Status: AC
  Administered 2023-12-18: 1000 mL via INTRAVENOUS

## 2023-12-18 MED ORDER — SODIUM CHLORIDE 0.9 % IV SOLN
12.5000 mg | Freq: Once | INTRAVENOUS | Status: AC
Start: 2023-12-18 — End: 2023-12-18
  Administered 2023-12-18: 12.5 mg via INTRAVENOUS
  Filled 2023-12-18: qty 0.5

## 2023-12-18 MED ORDER — LORAZEPAM 2 MG/ML IJ SOLN
1.0000 mg | Freq: Once | INTRAMUSCULAR | Status: AC
  Administered 2023-12-18: 1 mg via INTRAVENOUS
  Filled 2023-12-18: qty 1

## 2023-12-18 MED ORDER — IOHEXOL 350 MG/ML SOLN
75.0000 mL | Freq: Once | INTRAVENOUS | Status: AC | PRN
  Administered 2023-12-18: 75 mL via INTRAVENOUS

## 2023-12-18 MED ORDER — POTASSIUM CHLORIDE CRYS ER 20 MEQ PO TBCR
40.0000 meq | EXTENDED_RELEASE_TABLET | Freq: Once | ORAL | Status: AC
  Administered 2023-12-18: 40 meq via ORAL
  Filled 2023-12-18: qty 2

## 2023-12-18 MED ORDER — SODIUM CHLORIDE 0.9 % IV SOLN
12.5000 mg | Freq: Four times a day (QID) | INTRAVENOUS | Status: DC | PRN
  Administered 2023-12-18: 12.5 mg via INTRAVENOUS
  Filled 2023-12-18: qty 0.5

## 2023-12-18 NOTE — ED Notes (Signed)
 Pt up to bathroom, back in bed. Pt provided apple juice.

## 2023-12-18 NOTE — ED Notes (Signed)
 Patient transported to CT

## 2023-12-18 NOTE — H&P (Signed)
 History and Physical    Marilyn Richardson ZOX:[id number] DOB: Mar 12, 1996 DOA: 12/17/2023  Patient coming from: Home.  Chief Complaint: Abdominal pain with nausea vomiting and diarrhea.  HPI: Marilyn Richardson is a 28 y.o. 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  Presents to the ER with complaints of abdominal pain with headache nausea vomiting and diarrhea since yesterday morning.  Patient's abdominal pain is diffuse with multiple episodes of vomitus.  Has had 2-3 episodes of diarrhea.  Patient states she also has been having holocephalic headache.  Takes Nurtec for headache as needed.  Has not taken recently.  Patient used to take marijuana but patient states she has recently stopped taking it given the symptoms of vomiting.  ED Course: In the ER labs show bicarb of 16 and blood glucose of 222 anion gap of 19 lactic acid of 3.2 WBC count of 16.3 CT abdomen pelvis showing stable colitis involving the transverse and descending colon for which patient was started on Zosyn.  CT angiogram of the head and neck not show anything acute.  Since patient had an elevated anion gap with elevated blood sugar was started on IV insulin infusion and fluids for DKA.  Admitted for further management.  Review of Systems: As per HPI, rest all negative.   Past Medical History:  Diagnosis Date   Anxiety    Depression    doing good   Endometriosis    Hidradenitis    Infection    UTI   Medical history non-contributory     PCOS (polycystic ovarian syndrome)     Past Surgical History:  Procedure Laterality Date   NO PAST SURGERIES     WISDOM TOOTH EXTRACTION       reports that she quit smoking about 5 years ago. She started smoking about 10 years ago. She has a 5 pack-year smoking history. She has never used smokeless tobacco. She reports that she does not currently use alcohol. She reports current drug use. Drug: Marijuana.  Allergies  Allergen Reactions   Latex Hives   Other Hives    Plastic     Family History  Problem Relation Age of Onset   Factor V Leiden deficiency Mother    Factor V Leiden deficiency Maternal Aunt    Depression Father    Factor V Leiden deficiency Maternal Grandmother     Prior to Admission medications   Medication Sig Start Date End Date Taking? Authorizing Provider  acetaminophen (TYLENOL) 500 MG tablet Take 500 mg by mouth every 6 (six) hours as needed for mild pain or headache.    [provider]  ibuprofen (ADVIL) 600 MG tablet Take 1 tablet (600 mg total) by mouth every 6 (six) hours. 05/20/20   Morris, Aundra Millet, DO  oxyCODONE (OXY IR/ROXICODONE) 5 MG immediate release tablet Take 1 tablet (5 mg total) by mouth every 4 (four) hours as needed (pain scale 4-7). 05/20/20   Mitchel Honour, DO  Prenatal Vit-Fe Fumarate-FA (PRENATAL PO) Take 1 tablet by mouth daily.    [provider]  senna-docusate (SENOKOT-S) 8.6-50 MG  tablet Take 2 tablets by mouth daily. 05/21/20   Mitchel Honour, DO    Physical Exam: Constitutional: Moderately built and nourished. Vitals:   12/17/23 2210 12/18/23 0045 12/18/23 0115 12/18/23 0355  BP:  (!) 130/98 127/75 127/75  Pulse:  (!) 46 (!) 57 (!) 43  Resp:  16 19 17   Temp:    (!) 96.6 F (35.9 C)  TempSrc:    Temporal  SpO2:  100% 100% 100%  Weight: 88.5 kg     Height: 5\' 5"  (1.651 m)      Eyes: Anicteric no pallor. ENMT: No discharge from the ears eyes nose or mouth. Neck: No mass felt.  No neck rigidity. Respiratory: No  rhonchi or crepitations. Cardiovascular: S1-S2 heard. Abdomen: Soft nontender bowel sounds present. Musculoskeletal: No edema. Skin: No rash. Neurologic: Alert awake oriented to her name and place at times confused moving all extremities. Psychiatric: At times confused but denies any suicidal thoughts.   Labs on Admission: I have personally reviewed following labs and imaging studies  CBC: Recent Labs  Lab 12/17/23 2217 12/17/23 2359  WBC 16.3*  --   HGB 13.4 12.9  HCT 39.9 38.0  MCV 90.7  --   PLT 270  --    Basic Metabolic Panel: Recent Labs  Lab 12/17/23 2217 12/17/23 2359  NA 137 138  K 3.8 4.1  CL 102  --   CO2 16*  --   GLUCOSE 222*  --   BUN 9  --   CREATININE 0.94  --   CALCIUM 9.3  --    GFR: Estimated Creatinine Clearance: 97.9 mL/min (by C-G formula based on SCr of 0.94 mg/dL). Liver Function Tests: Recent Labs  Lab 12/17/23 2217  AST 33  ALT 19  ALKPHOS 59  BILITOT 0.9  PROT 7.3  ALBUMIN 4.2   Recent Labs  Lab 12/17/23 2217  LIPASE 32   No results for input(s): "AMMONIA" in the last 168 hours. Coagulation Profile: No results for input(s): "INR", "PROTIME" in the last 168 hours. Cardiac Enzymes: No results for input(s): "CKTOTAL", "CKMB", "CKMBINDEX", "TROPONINI" in the last 168 hours. BNP (last 3 results) No results for input(s): "PROBNP" in the last 8760 hours. HbA1C: No results for input(s): "HGBA1C" in the last 72 hours. CBG: Recent Labs  Lab 12/18/23 0223 12/18/23 0426  GLUCAP 158* 156*   Lipid Profile: No results for input(s): "CHOL", "HDL", "LDLCALC", "TRIG", "CHOLHDL", "LDLDIRECT" in the last 72 hours. Thyroid Function Tests: No results for input(s): "TSH", "T4TOTAL", "FREET4", "T3FREE", "THYROIDAB" in the last 72 hours. Anemia Panel: No results for input(s): "VITAMINB12", "FOLATE", "FERRITIN", "TIBC", "IRON", "RETICCTPCT" in the last 72 hours. Urine analysis:    Component Value Date/Time   COLORURINE YELLOW 12/18/2023  0135   APPEARANCEUR CLEAR 12/18/2023 0135   LABSPEC 1.024 12/18/2023 0135   PHURINE 7.0 12/18/2023 0135   GLUCOSEU >=500 (A) 12/18/2023 0135   HGBUR NEGATIVE 12/18/2023 0135   BILIRUBINUR NEGATIVE 12/18/2023 0135   KETONESUR 80 (A) 12/18/2023 0135   PROTEINUR NEGATIVE 12/18/2023 0135   NITRITE NEGATIVE 12/18/2023 0135   LEUKOCYTESUR NEGATIVE 12/18/2023 0135   Sepsis Labs: @LABRCNTIP (procalcitonin:4,lacticidven:4) )No results found for this or any previous visit (from the past 240 hours).   Radiological Exams on Admission: CT ANGIO HEAD NECK W WO CM Result Date: 12/18/2023 CLINICAL DATA:  Initial evaluation for acute headache. EXAM: CT ANGIOGRAPHY HEAD AND NECK WITH AND WITHOUT CONTRAST TECHNIQUE: Multidetector CT imaging of the head and neck was performed using the standard  protocol during bolus administration of intravenous contrast. Multiplanar CT image reconstructions and MIPs were obtained to evaluate the vascular anatomy. Carotid stenosis measurements (when applicable) are obtained utilizing NASCET criteria, using the distal internal carotid diameter as the denominator. RADIATION DOSE REDUCTION: This exam was performed according to the departmental dose-optimization program which includes automated exposure control, adjustment of the mA and/or kV according to patient size and/or use of iterative reconstruction technique. CONTRAST:  75mL OMNIPAQUE IOHEXOL 350 MG/ML SOLN COMPARISON:  Prior study from 05/31/2023. FINDINGS: CT HEAD FINDINGS Brain: Cerebral volume within normal limits for patient age. No acute intracranial hemorrhage. No acute large vessel territory infarct. No mass lesion, midline shift, or mass effect. Ventricles are normal in size without hydrocephalus. No extra-axial fluid collection. Vascular: No abnormal hyperdense vessel. Skull: Scalp soft tissues demonstrate no acute abnormality. Calvarium intact. Sinuses/Orbits: Globes and orbital soft tissues within normal limits.  Visualized paranasal sinuses are largely clear. No significant mastoid effusion. CTA NECK FINDINGS Aortic arch: Standard branching. Imaged portion shows no evidence of aneurysm or dissection. No significant stenosis of the major arch vessel origins. Right carotid system: No evidence of dissection, stenosis (50% or greater), or occlusion. Left carotid system: No evidence of dissection, stenosis (50% or greater), or occlusion. Vertebral arteries: No evidence of dissection, stenosis (50% or greater), or occlusion. Skeleton: No discrete or worrisome osseous lesions. Other neck: No other acute finding. Upper chest: No other acute finding. Review of the MIP images confirms the above findings CTA HEAD FINDINGS Anterior circulation: Both internal carotid arteries are patent to the termini without stenosis. 2 mm outpouching extending medially and slightly inferiorly from the cavernous left ICA (series 12, image 122). While this could reflect a small vascular infundibulum, a possible small aneurysm is difficult to exclude. A1 segments patent bilaterally. Normal anterior communicating artery complex. Anterior cerebral arteries patent without stenosis. No M1 stenosis or occlusion. Normal MCA bifurcations. Distal MCA branches perfused and symmetric. Posterior circulation: Right vertebral artery dominant and widely patent. Diminutive left vertebral artery largely terminates in PICA, although a small branch ascending towards the vertebrobasilar junction. Both PICA patent. Basilar patent without stenosis. Superior cerebellar and posterior cerebral arteries patent bilaterally. Venous sinuses: Patent allowing for timing the contrast bolus. Anatomic variants: As above. Review of the MIP images confirms the above findings IMPRESSION: 1. Negative CTA for acute large vessel occlusion or other emergent finding. 2. 2 mm outpouching extending from the cavernous left ICA as above. While this could reflect a small vascular infundibulum, a  possible small aneurysm is difficult to exclude. Attention at follow-up recommended. 3. Otherwise normal CTA of the head and neck. No other acute intracranial abnormality. Electronically Signed   By: Rise Mu M.D.   On: 12/18/2023 03:05   CT ABDOMEN PELVIS W CONTRAST Result Date: 12/18/2023 CLINICAL DATA:  Left upper quadrant abdominal pain EXAM: CT ABDOMEN AND PELVIS WITH CONTRAST TECHNIQUE: Multidetector CT imaging of the abdomen and pelvis was performed using the standard protocol following bolus administration of intravenous contrast. RADIATION DOSE REDUCTION: This exam was performed according to the departmental dose-optimization program which includes automated exposure control, adjustment of the mA and/or kV according to patient size and/or use of iterative reconstruction technique. CONTRAST:  75mL OMNIPAQUE IOHEXOL 350 MG/ML SOLN COMPARISON:  CT abdomen and pelvis 04/01/2021 FINDINGS: Lower chest: No acute abnormality. Hepatobiliary: Unremarkable liver. Cholecystectomy. No biliary dilation. Pancreas: Unremarkable. Spleen: Unremarkable. Adrenals/Urinary Tract: Normal adrenal glands. No urinary calculi or hydronephrosis. Bladder is unremarkable. Stomach/Bowel: Normal caliber large and  small bowel. Question mild wall thickening of the transverse and descending colon versus underdistention. No significant adjacent pericolonic fat stranding. The appendix is normal.Stomach is within normal limits. Vascular/Lymphatic: No significant vascular findings are present. No enlarged abdominal or pelvic lymph nodes. Reproductive: Unremarkable. Other: No free intraperitoneal fluid or air. Musculoskeletal: No acute fracture. IMPRESSION: 1. Question mild colitis of the transverse and descending colon versus underdistention. 2. Otherwise no acute abnormality in the abdomen or pelvis. Electronically Signed   By: Minerva Fester M.D.   On: 12/18/2023 02:40    EKG: Independently reviewed.  Has  bradycardia.  Assessment/Plan    Metabolic acidosis -     among the differentials include starvation ketosis due to intractable nausea vomiting.  Since patient's blood sugar was elevated there was concern for DKA and was started on insulin infusion.  Patient denies drinking any alcohol.  Will check hemoglobin A1c and discontinue if hemoglobin A1c is below 5.7.  Continue with hydration.  Follow metabolic panel lactic acid.   Intractable nausea vomiting and abdominal pain with diarrhea has recently followed up with gastroenterologist at [facility] and per gastroenterology patient's symptoms are usually related to her menstrual cycle and may be part of menstrual migraines and PMS.  Recommended OB/GYN follow-up.  CT scan shows possible colitis was started on Zosyn.  Will keep patient on as needed IV Phenergan as gastroenterologist noted that patient's nausea vomiting is better controlled with Phenergan.  Will advance diet as tolerated. Intractable headache with history of migraine and prior history of subarachnoid hemorrhage and intracranial aneurysm.  I did consult neurologist.  Appreciate their recommendations presently receiving Depakote Compazine for migraine headache. Sinus bradycardia check TSH.  Since patient has intractable nausea vomiting with abdominal pain metabolic acidosis will need close monitoring and more than 2 midnight stay.   DVT prophylaxis: SCDs. Code Status: Full code. Family Communication: Patient's grandfather at the bedside. Disposition Plan: Medical floor. Consults called: Neurologist. Admission status: Inpatient.

## 2023-12-18 NOTE — Progress Notes (Signed)
 [facility] PROGRESS NOTE   AZAYA GOEDDE OZH:[id number] DOB: [birth year] DOA: 12/17/2023  PCP: Patient, No Pcp Per  Brief History: 28 y.o. 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  Presented to the ER with complaints of abdominal pain with headache nausea vomiting and diarrhea.  CT of the abdomen suggested colitis.  Patient was hospitalized for further management.   Consultants: Neurology  Procedures: None    Subjective/Interval History: Patient mentions that her headache is better.  Continues to have abdominal pain.  Did have episodes of diarrhea recently but none since she has been here in the hospital.    Assessment/Plan:  Nausea vomiting and diarrhea/acute colitis CT scan raised concern for colitis.  No other acute abnormalities were noted.  Patient mentions that she had diarrheal episodes recently.  None has been observed in the hospital. She might of had viral gastroenteritis. Okay to continue with Zosyn for now. Her WBC was elevated at 16.3.  Will repeat labs in the morning. Stool studies will be ordered if she has episodes of diarrhea.  Intractable headache in the setting of history of migraine and cerebral aneurysm CT angiogram did not show any subarachnoid hemorrhage.  Patient was seen by neurology.  Migraine cocktail was ordered including Benadryl Compazine valproic acid.  She was given IV magnesium and fluid bolus.  No further investigations are thought to be  necessary at this time. Headache seems to be improving.  Metabolic acidosis Possibly due to GI loss.  Initially there was some concern for diabetic ketoacidosis due to increase in anion gap but patient does not have any history of diabetes.  HbA1c is 5.4.  Reason for hypoglycemia is not entirely clear.  Does not appear that she has DKA.  Insulin has been discontinued.  Anion gap has improved this morning. Continue to monitor.  Hyperglycemia Etiology unclear.  HbA1c only 5.4.  Could be due to acute stress.  No recent use of steroids as per patient.  Sinus bradycardia TSH is normal.  Continue to monitor.  Obesity Estimated body mass index is 32.45 kg/m as calculated from the following:   Height as of this encounter: 5\' 5"  (1.651 m).   Weight as of this encounter: 88.5 kg.   DVT Prophylaxis: SCDs Code Status: Full code Family Communication: Discussed with patient Disposition Plan: Hopefully return home in 24 hours  Status is: Inpatient Remains inpatient appropriate because: Intractable headache, abdominal pain with nausea and vomiting      Medications: Scheduled:  pantoprazole (PROTONIX) IV  40 mg Intravenous Q24H   Continuous:  lactated ringers 125 mL/hr at 12/18/23 0826   piperacillin-tazobactam (ZOSYN)  IV     promethazine (PHENERGAN) injection (IM or IVPB)     GEX:[id number] (PHENERGAN) injection (IM or IVPB)  Antibiotics: Anti-infectives (From admission, onward)    Start     Dose/Rate Route Frequency Ordered Stop   12/18/23 1200  piperacillin-tazobactam (ZOSYN) IVPB 3.375 g        3.375 g 12.5 mL/hr over 240 Minutes Intravenous Every 8 hours 12/18/23 0557     12/18/23 0315  piperacillin-tazobactam (ZOSYN) IVPB 3.375 g        3.375 g 100 mL/hr over 30 Minutes Intravenous  Once 12/18/23 0314 12/18/23 0716       Objective:  Vital Signs  Vitals:   12/18/23 0045 12/18/23 0115 12/18/23 0355 12/18/23 0743  BP: (!) 130/98 127/75 127/75 118/76  Pulse: (!) 46  (!) 57 (!) 43 (!) 56  Resp: 16 19 17 16   Temp:   (!) 96.6 F (35.9 C) (!) 97 F (36.1 C)  TempSrc:   Temporal Oral  SpO2: 100% 100% 100% 100%  Weight:      Height:        Intake/Output Summary (Last 24 hours) at 12/18/2023 0958 Last data filed at 12/18/2023 0515 Gross per 24 hour  Intake 4067.34 ml  Output --  Net 4067.34 ml   Filed Weights   12/17/23 2210  Weight: 88.5 kg    General appearance: Awake alert.  In no distress Resp: Clear to auscultation bilaterally.  Normal effort Cardio: S1-S2 is normal regular.  No S3-S4.  No rubs murmurs or bruit GI: Abdomen is soft.  Tender in the lower quadrant without any rebound rigidity or guarding.  No masses organomegaly. Extremities: No edema.  Full range of motion of lower extremities. Neurologic:  No focal neurological deficits.    Lab Results:  Data Reviewed: I have personally reviewed following labs and reports of the imaging studies  CBC: Recent Labs  Lab 12/17/23 2217 12/17/23 2359  WBC 16.3*  --   HGB 13.4 12.9  HCT 39.9 38.0  MCV 90.7  --   PLT 270  --     Basic Metabolic Panel: Recent Labs  Lab 12/17/23 2217 12/17/23 2359 12/18/23 0604 12/18/23 0744  NA 137 138 136 137  K 3.8 4.1 3.6 3.5  CL 102  --  105 105  CO2 16*  --  18* 18*  GLUCOSE 222*  --  174* 168*  BUN 9  --  <5* <5*  CREATININE 0.94  --  0.75 0.61  CALCIUM 9.3  --  8.9 8.1*    GFR: Estimated Creatinine Clearance: 115 mL/min (by C-G formula based on SCr of 0.61 mg/dL).  Liver Function Tests: Recent Labs  Lab 12/17/23 2217  AST 33  ALT 19  ALKPHOS 59  BILITOT 0.9  PROT 7.3  ALBUMIN 4.2    Recent Labs  Lab 12/17/23 2217  LIPASE 32    HbA1C: Recent Labs    12/18/23 0604  HGBA1C 5.4    CBG: Recent Labs  Lab 12/18/23 0223 12/18/23 0426 12/18/23 0537 12/18/23 0654 12/18/23 0750  GLUCAP 158* 156* 150* 157* 145*    Thyroid Function Tests: Recent Labs    12/18/23 0744  TSH 0.892     Radiology Studies: CT  ANGIO HEAD NECK W WO CM Result Date: 12/18/2023 CLINICAL DATA:  Initial evaluation for acute headache. EXAM: CT ANGIOGRAPHY HEAD AND NECK WITH AND WITHOUT CONTRAST TECHNIQUE: Multidetector CT imaging of the head and neck was performed using the standard protocol during bolus administration of intravenous contrast. Multiplanar CT image reconstructions and MIPs were obtained to evaluate the vascular anatomy. Carotid stenosis measurements (when applicable) are obtained utilizing NASCET criteria, using the distal internal carotid diameter as the denominator. RADIATION DOSE REDUCTION: This exam was performed according to the departmental dose-optimization program which includes automated exposure control, adjustment of the mA and/or kV according to patient size and/or use of iterative reconstruction technique. CONTRAST:  75mL OMNIPAQUE IOHEXOL 350 MG/ML  SOLN COMPARISON:  Prior study from 05/31/2023. FINDINGS: CT HEAD FINDINGS Brain: Cerebral volume within normal limits for patient age. No acute intracranial hemorrhage. No acute large vessel territory infarct. No mass lesion, midline shift, or mass effect. Ventricles are normal in size without hydrocephalus. No extra-axial fluid collection. Vascular: No abnormal hyperdense vessel. Skull: Scalp soft tissues demonstrate no acute abnormality. Calvarium intact. Sinuses/Orbits: Globes and orbital soft tissues within normal limits. Visualized paranasal sinuses are largely clear. No significant mastoid effusion. CTA NECK FINDINGS Aortic arch: Standard branching. Imaged portion shows no evidence of aneurysm or dissection. No significant stenosis of the major arch vessel origins. Right carotid system: No evidence of dissection, stenosis (50% or greater), or occlusion. Left carotid system: No evidence of dissection, stenosis (50% or greater), or occlusion. Vertebral arteries: No evidence of dissection, stenosis (50% or greater), or occlusion. Skeleton: No discrete or worrisome  osseous lesions. Other neck: No other acute finding. Upper chest: No other acute finding. Review of the MIP images confirms the above findings CTA HEAD FINDINGS Anterior circulation: Both internal carotid arteries are patent to the termini without stenosis. 2 mm outpouching extending medially and slightly inferiorly from the cavernous left ICA (series 12, image 122). While this could reflect a small vascular infundibulum, a possible small aneurysm is difficult to exclude. A1 segments patent bilaterally. Normal anterior communicating artery complex. Anterior cerebral arteries patent without stenosis. No M1 stenosis or occlusion. Normal MCA bifurcations. Distal MCA branches perfused and symmetric. Posterior circulation: Right vertebral artery dominant and widely patent. Diminutive left vertebral artery largely terminates in PICA, although a small branch ascending towards the vertebrobasilar junction. Both PICA patent. Basilar patent without stenosis. Superior cerebellar and posterior cerebral arteries patent bilaterally. Venous sinuses: Patent allowing for timing the contrast bolus. Anatomic variants: As above. Review of the MIP images confirms the above findings IMPRESSION: 1. Negative CTA for acute large vessel occlusion or other emergent finding. 2. 2 mm outpouching extending from the cavernous left ICA as above. While this could reflect a small vascular infundibulum, a possible small aneurysm is difficult to exclude. Attention at follow-up recommended. 3. Otherwise normal CTA of the head and neck. No other acute intracranial abnormality. Electronically Signed   By: Rise Mu M.D.   On: 12/18/2023 03:05   CT ABDOMEN PELVIS W CONTRAST Result Date: 12/18/2023 CLINICAL DATA:  Left upper quadrant abdominal pain EXAM: CT ABDOMEN AND PELVIS WITH CONTRAST TECHNIQUE: Multidetector CT imaging of the abdomen and pelvis was performed using the standard protocol following bolus administration of intravenous  contrast. RADIATION DOSE REDUCTION: This exam was performed according to the departmental dose-optimization program which includes automated exposure control, adjustment of the mA and/or kV according to patient size and/or use of iterative reconstruction technique. CONTRAST:  75mL OMNIPAQUE IOHEXOL 350 MG/ML SOLN COMPARISON:  CT abdomen and pelvis 04/01/2021 FINDINGS: Lower chest: No acute abnormality. Hepatobiliary: Unremarkable liver. Cholecystectomy. No biliary dilation. Pancreas: Unremarkable. Spleen: Unremarkable. Adrenals/Urinary Tract: Normal adrenal glands. No urinary calculi or hydronephrosis. Bladder is unremarkable. Stomach/Bowel: Normal caliber large and small bowel. Question mild wall thickening of the transverse and descending colon versus underdistention. No significant adjacent pericolonic fat stranding. The appendix is normal.Stomach is within normal limits. Vascular/Lymphatic: No significant vascular findings are present. No enlarged abdominal or pelvic lymph nodes. Reproductive: Unremarkable. Other: No free intraperitoneal fluid or air. Musculoskeletal: No acute fracture. IMPRESSION: 1. Question mild colitis of the transverse and descending colon versus underdistention. 2. Otherwise no acute abnormality in the abdomen or pelvis. Electronically Signed  By: Minerva Fester M.D.   On: 12/18/2023 02:40       LOS: 0 days   Arbie Reisz Rito Ehrlich  [facility] Pager on [email or web address]  12/18/2023, 9:58 AM

## 2023-12-18 NOTE — Consult Note (Addendum)
 NEUROLOGY CONSULT NOTE   Date of service: December 18, 2023 Patient Name: Marilyn Richardson MRN:  [id number] DOB:  1996/02/03 Chief Complaint: "headache along with abdominal pain, prior hx of SAH" Requesting Provider: Eduard Clos, MD  History of Present Illness  Marilyn Richardson ARUNA NESTLER is a 28 y.o. female with hx of depression, migrainous headache, spontaneous SAH in aug 2024, PCOS who presents with abdominal pain, vomiting that started today in the morning.  Is the left side of her belly that hurts.  She had CT of the abdomen pelvis which is concerning for potential colitis.  She is also been reporting some headache and has a prior history of subarachnoid hemorrhage that was spontaneous back in August 2024.  She reports that since the thunderclap headache episode back in August, she has had headaches pretty much every day all day.  She describes that her current headache is a 5 out of 10 and is dull in nature and is not worse than her usual baseline.  She describes the current headache as holocephalic.  He follows with neurology outpatient and reports that she has been taking medications for migraines.  She has slight light and noise sensitivity which is typical for her headaches.  She had CT angio head and neck with and without contrast which was negative for any acute intracranial normalities.  Specifically, was negative for any subarachnoid hemorrhage.  She has a 2 mm outpouching extending from the cavernous left ICA which could reflect a small vascular infundibulum versus small aneurysm.   ROS  Comprehensive ROS performed and pertinent positives documented in HPI    Past History   Past Medical History:  Diagnosis Date   Anxiety    Depression    doing good   Endometriosis    Hidradenitis    Infection    UTI   Medical history non-contributory    PCOS (polycystic ovarian syndrome)     Past Surgical History:  Procedure Laterality Date   NO PAST SURGERIES     WISDOM TOOTH EXTRACTION       Family History: Family History  Problem Relation Age of Onset   Factor V Leiden deficiency Mother    Factor V Leiden deficiency Maternal Aunt    Depression Father    Factor V Leiden deficiency Maternal Grandmother     Social History  reports that she quit smoking about 5 years ago. She started smoking about 10 years ago. She has a 5 pack-year smoking history. She has never used smokeless tobacco. She reports that she does not currently use alcohol. She reports current drug use. Drug: Marijuana.  Allergies  Allergen Reactions   Latex Hives   Other Hives    Plastic     Medications   Current Facility-Administered Medications:    dextrose 5 % in lactated ringers infusion, , Intravenous, Continuous, Eduard Clos, MD, Last Rate: 125 mL/hr at 12/18/23 0543, New Bag at 12/18/23 0543   dextrose 50 % solution 0-50 mL, 0-50 mL, Intravenous, PRN, Eduard Clos, MD   insulin regular, human (MYXREDLIN) 100 units/ 100 mL infusion, , Intravenous, Continuous, Eduard Clos, MD, Last Rate: 2.4 mL/hr at 12/18/23 0542, 2.4 Units/hr at 12/18/23 0542   lactated ringers infusion, , Intravenous, Continuous, Eduard Clos, MD   piperacillin-tazobactam (ZOSYN) IVPB 3.375 g, 3.375 g, Intravenous, Once, Eduard Clos, MD  Current Outpatient Medications:    ALPRAZolam (XANAX) 0.5 MG tablet, 0.5 mg daily as needed (for panic attacks)., Disp: , Rfl:  amphetamine-dextroamphetamine (ADDERALL XR) 20 MG 24 hr capsule, Take 20 mg by mouth every morning., Disp: , Rfl:    cloNIDine (CATAPRES) 0.1 MG tablet, Take 0.1 mg by mouth at bedtime., Disp: , Rfl:    hydrOXYzine (ATARAX) 25 MG tablet, Take 25 mg by mouth every 6 (six) hours., Disp: , Rfl:    magnesium oxide (MAG-OX) 400 MG tablet, Take 400 mg by mouth daily., Disp: , Rfl:    sertraline (ZOLOFT) 100 MG tablet, Take 200 mg by mouth every morning., Disp: , Rfl:    acetaminophen (TYLENOL) 500 MG tablet, Take 500 mg by  mouth every 6 (six) hours as needed for mild pain or headache., Disp: , Rfl:    Prenatal Vit-Fe Fumarate-FA (PRENATAL PO), Take 1 tablet by mouth daily., Disp: , Rfl:    senna-docusate (SENOKOT-S) 8.6-50 MG tablet, Take 2 tablets by mouth daily., Disp: 60 tablet, Rfl: 0  Vitals   Vitals:   12/17/23 2210 12/18/23 0045 12/18/23 0115 12/18/23 0355  BP:  (!) 130/98 127/75 127/75  Pulse:  (!) 46 (!) 57 (!) 43  Resp:  16 19 17   Temp:    (!) 96.6 F (35.9 C)  TempSrc:    Temporal  SpO2:  100% 100% 100%  Weight: 88.5 kg     Height: 5\' 5"  (1.651 m)       Body mass index is 32.45 kg/m.  Physical Exam   General: Laying on her side in bed; holding her belly due to pain.   HENT: Normal oropharynx and mucosa. Normal external appearance of ears and nose.  Neck: Supple, no pain or tenderness  CV: No JVD. No peripheral edema.  Pulmonary: Symmetric Chest rise. Normal respiratory effort.  Abdomen: Soft to touch, non-tender.  Ext: No cyanosis, edema, or deformity  Skin: No rash. Normal palpation of skin.   Musculoskeletal: Normal digits and nails by inspection. No clubbing.   Neurologic Examination  Mental status/Cognition: tired but, oriented to self, place, month and year, good attention.  Speech/language: Fluent, comprehension intact, object naming intact, repetition intact.  Cranial nerves:   CN II Pupils equal and reactive to light, no VF deficits    CN III,IV,VI EOM intact, no gaze preference or deviation, no nystagmus    CN V normal sensation in V1, V2, and V3 segments bilaterally    CN VII no asymmetry, no nasolabial fold flattening    CN VIII normal hearing to speech    CN IX & X normal palatal elevation, no uvular deviation    CN XI 5/5 head turn and 5/5 shoulder shrug bilaterally    CN XII midline tongue protrusion    Motor:  Muscle bulk: normal, tone normal, pronator drift none tremor none Mvmt Root Nerve  Muscle Right Left Comments  SA C5/6 Ax Deltoid 5 5   EF C5/6 Mc  Biceps 5 5   EE C6/7/8 Rad Triceps 5 5   WF C6/7 Med FCR     WE C7/8 PIN ECU     F Ab C8/T1 U ADM/FDI 5 5   HF L1/2/3 Fem Illopsoas 5 5   KE L2/3/4 Fem Quad 5 5   DF L4/5 D Peron Tib Ant 5 5   PF S1/2 Tibial Grc/Sol 5 5    Sensation:  Light touch Intact throughout   Pin prick    Temperature    Vibration   Proprioception    Coordination/Complex Motor:  - Finger to Nose intact BL - Heel to shin intact BL -  Rapid alternating movement are normal - Gait: deferred.  Labs/Imaging/Neurodiagnostic studies   CBC:  Recent Labs  Lab 12/19/23 2217 December 19, 2023 2359  WBC 16.3*  --   HGB 13.4 12.9  HCT 39.9 38.0  MCV 90.7  --   PLT 270  --    Basic Metabolic Panel:  Lab Results  Component Value Date   NA 138 12-19-2023   K 4.1 19-Dec-2023   CO2 16 (L) 12/19/2023   GLUCOSE 222 (H) Dec 19, 2023   BUN 9 19-Dec-2023   CREATININE 0.94 December 19, 2023   CALCIUM 9.3 12-19-2023   GFRNONAA >60 12/19/2023   GFRAA >60 09/10/2019   Lipid Panel: No results found for: "LDLCALC" HgbA1c: No results found for: "HGBA1C" Urine Drug Screen:     Component Value Date/Time   LABOPIA NONE DETECTED 09/10/2019 1501   COCAINSCRNUR NONE DETECTED 09/10/2019 1501   LABBENZ NONE DETECTED 09/10/2019 1501   AMPHETMU NONE DETECTED 09/10/2019 1501   THCU POSITIVE (A) 09/10/2019 1501   LABBARB NONE DETECTED 09/10/2019 1501    Alcohol Level No results found for: "ETH" INR  Lab Results  Component Value Date   INR 1.0 05/18/2020   APTT  Lab Results  Component Value Date   APTT 26 05/18/2020   AED levels: No results found for: "PHENYTOIN", "ZONISAMIDE", "LAMOTRIGINE", "LEVETIRACETA"  CT Head without contrast(Personally reviewed): CTH was negative for a large hypodensity concerning for a large territory infarct or hyperdensity concerning for an ICH   CT angio Head and Neck with contrast(Personally reviewed): No LVO ASSESSMENT   Aniyia S Norment is a 28 y.o. female with hx of depression, migrainous  headache, spontaneous SAH in aug 2024, PCOS who presents with abdominal pain, vomiting and admitted for potential colitis and DKA. She has baseline headache, specially worsening migraines after her SAH back in August of 2024 and feels that her current headache is typical of her migrainout headache. She is not confused, denies any thunderclap headache, no meningismus on exam.  Overall, suspicion for recurrent SAH is low. Her CTA head and neck is negative for [facility].  RECOMMENDATIONS  - Migraine cocktail with benadryl, compazine, valproic acid, fluids bolus and IV magnesium. - no further inpatient neurologic workup recommended at this time. I think a CT Venogram head would be low yield. We will signoff. Please feel free to contact us with any questions or concerns. ______________________________________________________________________   Plan discussed with Dr. Manus Gunning and with Dr. Toniann Fail with the hospitalist team.  Signed, Erick Blinks, MD [facility]

## 2023-12-18 NOTE — Progress Notes (Signed)
 Pharmacy Antibiotic Note  Marilyn Richardson is a 28 y.o. female admitted on 12/17/2023 with intra-abdominal infection.  Pharmacy has been consulted for Zosyn dosing.  Plan: Zosyn 3.375g IV q8h (4 hour infusion).  Height: 5\' 5"  (165.1 cm) Weight: 88.5 kg (195 lb) IBW/kg (Calculated) : 57  Temp (24hrs), Avg:97.1 F (36.2 C), Min:96.6 F (35.9 C), Max:97.6 F (36.4 C)  Recent Labs  Lab 12/17/23 2217 12/17/23 2345 12/18/23 0154  WBC 16.3*  --   --   CREATININE 0.94  --   --   LATICACIDVEN  --  3.2* 4.3*    Estimated Creatinine Clearance: 97.9 mL/min (by C-G formula based on SCr of 0.94 mg/dL).    Allergies  Allergen Reactions   Latex Hives   Other Hives    Plastic     Thank you for allowing pharmacy to be a part of this patient's care.  Vernard Gambles, PharmD, BCPS  12/18/2023 5:55 AM

## 2023-12-18 NOTE — Progress Notes (Signed)
   12/18/23 1446  TOC Brief Assessment  Insurance and Status Reviewed  Patient has primary care physician Yes (updated chart)  Home environment has been reviewed home alone  Prior level of function: independent  Prior/Current Home Services No current home services  Social Drivers of Health Review SDOH reviewed no interventions necessary  Readmission risk has been reviewed Yes  Transition of care needs no transition of care needs at this time     Patient's PCP is Counselling psychologist at [facility]. Updated chart

## 2023-12-19 ENCOUNTER — Other Ambulatory Visit (HOSPITAL_COMMUNITY): Payer: Self-pay

## 2023-12-19 DIAGNOSIS — K529 Noninfective gastroenteritis and colitis, unspecified: Secondary | ICD-10-CM

## 2023-12-19 LAB — CBC
HCT: 31.5 % — ABNORMAL LOW (ref 36.0–46.0)
Hemoglobin: 10.7 g/dL — ABNORMAL LOW (ref 12.0–15.0)
MCH: 30.7 pg (ref 26.0–34.0)
MCHC: 34 g/dL (ref 30.0–36.0)
MCV: 90.5 fL (ref 80.0–100.0)
Platelets: 196 10*3/uL (ref 150–400)
RBC: 3.48 MIL/uL — ABNORMAL LOW (ref 3.87–5.11)
RDW: 14.1 % (ref 11.5–15.5)
WBC: 8.2 10*3/uL (ref 4.0–10.5)
nRBC: 0 % (ref 0.0–0.2)

## 2023-12-19 LAB — BASIC METABOLIC PANEL
Anion gap: 7 (ref 5–15)
BUN: <5 mg/dL — ABNORMAL LOW (ref 6–20)
CO2: 26 mmol/L (ref 22–32)
Calcium: 8.4 mg/dL — ABNORMAL LOW (ref 8.9–10.3)
Chloride: 109 mmol/L (ref 98–111)
Creatinine, Ser: 0.77 mg/dL (ref 0.44–1.00)
GFR, Estimated: >60 mL/min (ref >60)
Glucose, Bld: 103 mg/dL — ABNORMAL HIGH (ref 70–99)
Potassium: 3.5 mmol/L (ref 3.5–5.1)
Sodium: 142 mmol/L (ref 135–145)

## 2023-12-19 MED ORDER — AMOXICILLIN-POT CLAVULANATE 875-125 MG PO TABS
1.0000 | ORAL_TABLET | Freq: Two times a day (BID) | ORAL | Status: DC
  Administered 2023-12-19: 1 via ORAL
  Filled 2023-12-19: qty 1

## 2023-12-19 MED ORDER — AMOXICILLIN-POT CLAVULANATE 875-125 MG PO TABS
1.0000 | ORAL_TABLET | Freq: Two times a day (BID) | ORAL | 0 refills | Status: AC
Start: 2023-12-19 — End: 2023-12-24
  Filled 2023-12-19: qty 10, 5d supply, fill #0

## 2023-12-19 MED ORDER — ACIDOPHILUS PO CAPS
ORAL_CAPSULE | Freq: Two times a day (BID) | ORAL | 0 refills | Status: AC
  Filled 2023-12-19: qty 28, 14d supply, fill #0

## 2023-12-19 MED ORDER — ALUM & MAG HYDROXIDE-SIMETH 200-200-20 MG/5ML PO SUSP
30.0000 mL | Freq: Once | ORAL | Status: AC
  Administered 2023-12-19: 30 mL via ORAL
  Filled 2023-12-19: qty 30

## 2023-12-19 MED ORDER — PANTOPRAZOLE SODIUM 40 MG PO TBEC
40.0000 mg | DELAYED_RELEASE_TABLET | Freq: Every day | ORAL | 0 refills | Status: AC
  Filled 2023-12-19: qty 14, 14d supply, fill #0

## 2023-12-19 NOTE — Discharge Summary (Signed)
 [facility]  Physician Discharge Summary   Patient ID: Marilyn Richardson MRN: [id number] DOB/AGE: [birth year] 28 y.o.  Admit date: 12/17/2023 Discharge date:   12/19/2023   PCP: Lars Mage, NP  DISCHARGE DIAGNOSES:    Metabolic acidosis,, resolved Acute colitis versus acute gastroenteritis   History of subarachnoid hemorrhage   Migraine   RECOMMENDATIONS FOR OUTPATIENT FOLLOW UP: Follow-up with outpatient providers.   Home Health: None Equipment/Devices: None  CODE STATUS: Full code  DISCHARGE CONDITION: fair  Diet recommendation: As before  INITIAL HISTORY: 28 y.o. 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  Presented to the ER with complaints of abdominal pain with headache nausea vomiting and diarrhea.  CT of the abdomen suggested colitis.  Patient was hospitalized for further management.    Consultants: Neurology   Procedures: None  HOSPITAL COURSE:   Nausea vomiting and diarrhea/acute colitis CT scan raised concern for colitis.  No other acute abnormalities were noted.  Patient mentions that she had diarrheal episodes recently.  None has been observed in the hospital. She might have had viral gastroenteritis. WBC was elevated at 16.3.  WBC is normal today.  Diarrhea has resolved.  Will transition to Augmentin and discharged on 5-day course.  Probiotics will be ordered as well. Hopefully discharge today if she tolerates her diet. Drop in hemoglobin is noted but  most likely dilutional.  No evidence for overt bleeding.  Intractable headache in the setting of history of migraine and cerebral aneurysm CT angiogram did not show any subarachnoid hemorrhage.  Patient was seen by neurology.  Migraine cocktail was ordered including Benadryl Compazine valproic acid.  She was given IV magnesium and fluid bolus.  No further investigations are thought to be necessary at this time. Headache has resolved.   Metabolic acidosis Possibly due to GI loss.  Initially there was some concern for diabetic ketoacidosis due to increase in anion gap but patient does not have any history of diabetes.  HbA1c is 5.4.  Reason for hypoglycemia is not entirely clear.  Does not appear that she has DKA.  Insulin has been discontinued.  Anion gap is normal now.  Metabolic acidosis most likely due to GI loss.  Resolved this morning.     Hyperglycemia Etiology unclear.  HbA1c only 5.4.  Could be due to acute stress.  No recent use of steroids as per patient.  Glucose levels have improved.   Sinus bradycardia TSH is normal.  She is asymptomatic   Obesity Estimated body mass index is 32.45 kg/m as calculated from the following:   Height as of this encounter: 5\' 5"  (1.651 m).   Weight as of this encounter: 88.5 kg.  Patient feels better.  If she tolerates a diet she could be discharged later today.   PERTINENT LABS:  The results of significant diagnostics from this hospitalization (including imaging, microbiology, ancillary and laboratory) are listed below for reference.    Labs:   Basic Metabolic Panel: Recent Labs  Lab 12/17/23 2217 12/17/23 2359 12/18/23 0604 12/18/23 0744 12/19/23 0800  NA 137 138 136 137 142  K 3.8 4.1 3.6 3.5 3.5  CL 102  --  105 105 109  CO2 16*  --  18* 18* 26  GLUCOSE 222*  --  174* 168* 103*  BUN 9  --  <5* <5* <5*  CREATININE 0.94  --  0.75 0.61 0.77  CALCIUM 9.3  --  8.9 8.1* 8.4*   Liver Function Tests: Recent Labs  Lab 12/17/23 2217   AST 33  ALT 19  ALKPHOS 59  BILITOT 0.9  PROT 7.3  ALBUMIN 4.2   Recent Labs  Lab 12/17/23 2217  LIPASE 32    CBC: Recent Labs  Lab 12/17/23 2217 12/17/23 2359 12/19/23 0800  WBC 16.3*  --  8.2  HGB 13.4 12.9 10.7*  HCT 39.9 38.0 31.5*  MCV 90.7  --  90.5  PLT 270  --  196    CBG: Recent Labs  Lab 12/18/23 0223 12/18/23 0426 12/18/23 0537 12/18/23 0654 12/18/23 0750  GLUCAP 158* 156* 150* 157* 145*     IMAGING STUDIES CT ANGIO HEAD NECK W WO CM Result Date: 12/18/2023 CLINICAL DATA:  Initial evaluation for acute headache. EXAM: CT ANGIOGRAPHY HEAD AND NECK WITH AND WITHOUT CONTRAST TECHNIQUE: Multidetector CT imaging of the head and neck was performed using the standard protocol during bolus administration of intravenous contrast. Multiplanar CT image reconstructions and MIPs were obtained to evaluate the vascular anatomy. Carotid stenosis measurements (when applicable) are obtained utilizing NASCET criteria, using the distal internal carotid diameter as the denominator. RADIATION DOSE REDUCTION: This exam was performed according to the departmental dose-optimization program which includes automated exposure control, adjustment of the mA and/or kV according to patient size and/or use of iterative reconstruction technique. CONTRAST:  75mL OMNIPAQUE IOHEXOL 350 MG/ML SOLN COMPARISON:  Prior study from 05/31/2023. FINDINGS: CT HEAD FINDINGS Brain: Cerebral volume within normal limits for patient age. No acute intracranial hemorrhage. No acute large vessel territory infarct. No mass lesion, midline shift, or mass effect. Ventricles are normal in size without hydrocephalus. No extra-axial fluid collection. Vascular: No abnormal hyperdense vessel. Skull: Scalp soft tissues demonstrate no acute abnormality. Calvarium intact. Sinuses/Orbits: Globes and orbital soft tissues within normal limits. Visualized paranasal sinuses are largely clear. No significant mastoid effusion. CTA NECK  FINDINGS Aortic arch: Standard branching. Imaged portion shows no evidence of aneurysm or dissection. No significant stenosis of the major arch vessel origins. Right carotid system: No evidence of dissection, stenosis (50% or greater), or occlusion. Left carotid system: No evidence of dissection, stenosis (50% or greater), or occlusion. Vertebral arteries: No evidence of dissection, stenosis (50% or greater), or occlusion. Skeleton: No discrete or worrisome osseous lesions. Other neck: No other acute finding. Upper chest: No other acute finding. Review of the MIP images confirms the above findings CTA HEAD FINDINGS Anterior circulation: Both internal carotid arteries are patent to the termini without stenosis. 2 mm outpouching extending medially and slightly inferiorly from the cavernous left ICA (series 12, image 122). While this could reflect a small vascular infundibulum, a possible small aneurysm is difficult to exclude. A1 segments patent bilaterally. Normal anterior communicating artery complex. Anterior cerebral arteries patent without stenosis. No M1 stenosis or occlusion. Normal MCA bifurcations. Distal MCA branches perfused and symmetric. Posterior circulation: Right vertebral artery dominant and widely patent. Diminutive left vertebral artery largely terminates in PICA, although a small branch ascending towards the vertebrobasilar junction. Both PICA patent. Basilar patent without stenosis. Superior cerebellar and posterior cerebral arteries patent bilaterally. Venous sinuses: Patent allowing for timing the  contrast bolus. Anatomic variants: As above. Review of the MIP images confirms the above findings IMPRESSION: 1. Negative CTA for acute large vessel occlusion or other emergent finding. 2. 2 mm outpouching extending from the cavernous left ICA as above. While this could reflect a small vascular infundibulum, a possible small aneurysm is difficult to exclude. Attention at follow-up recommended. 3.  Otherwise normal CTA of the head and neck. No other acute intracranial abnormality. Electronically Signed   By: Rise Mu M.D.   On: 12/18/2023 03:05   CT ABDOMEN PELVIS W CONTRAST Result Date: 12/18/2023 CLINICAL DATA:  Left upper quadrant abdominal pain EXAM: CT ABDOMEN AND PELVIS WITH CONTRAST TECHNIQUE: Multidetector CT imaging of the abdomen and pelvis was performed using the standard protocol following bolus administration of intravenous contrast. RADIATION DOSE REDUCTION: This exam was performed according to the departmental dose-optimization program which includes automated exposure control, adjustment of the mA and/or kV according to patient size and/or use of iterative reconstruction technique. CONTRAST:  75mL OMNIPAQUE IOHEXOL 350 MG/ML SOLN COMPARISON:  CT abdomen and pelvis 04/01/2021 FINDINGS: Lower chest: No acute abnormality. Hepatobiliary: Unremarkable liver. Cholecystectomy. No biliary dilation. Pancreas: Unremarkable. Spleen: Unremarkable. Adrenals/Urinary Tract: Normal adrenal glands. No urinary calculi or hydronephrosis. Bladder is unremarkable. Stomach/Bowel: Normal caliber large and small bowel. Question mild wall thickening of the transverse and descending colon versus underdistention. No significant adjacent pericolonic fat stranding. The appendix is normal.Stomach is within normal limits. Vascular/Lymphatic: No significant vascular findings are present. No enlarged abdominal or pelvic lymph nodes. Reproductive: Unremarkable. Other: No free intraperitoneal fluid or air. Musculoskeletal: No acute fracture. IMPRESSION: 1. Question mild colitis of the transverse and descending colon versus underdistention. 2. Otherwise no acute abnormality in the abdomen or pelvis. Electronically Signed   By: Minerva Fester M.D.   On: 12/18/2023 02:40    DISCHARGE EXAMINATION: Vitals:   12/18/23 1528 12/18/23 2018 12/19/23 0416 12/19/23 0833  BP: (!) 147/85 113/63 112/66 (!) 99/50  Pulse:  (!) 56 (!) 49 63 72  Resp: 16 18 17 18   Temp: 98.5 F (36.9 C) 98.7 F (37.1 C) 98.6 F (37 C) 98.8 F (37.1 C)  TempSrc: Oral Oral  Oral  SpO2: 100% 97% 98% 96%  Weight:      Height:       General appearance: Awake alert.  In no distress Resp: Clear to auscultation bilaterally.  Normal effort Cardio: S1-S2 is normal regular.  No S3-S4.  No rubs murmurs or bruit GI: Abdomen is soft.  Nontender nondistended.  Bowel sounds are present normal.  No masses organomegaly  DISPOSITION: Home  Discharge Instructions     (HEART FAILURE PATIENTS) Call MD:  Anytime you have any of the following symptoms: 1) 3 pound weight gain in 24 hours or 5 pounds in 1 week 2) shortness of breath, with or without a dry hacking cough 3) swelling in the hands, feet or stomach 4) if you have to sleep on extra pillows at night in order to breathe.   Complete by: As directed    Call MD for:  difficulty breathing, headache or visual disturbances   Complete by: As directed    Call MD for:  extreme fatigue   Complete by: As directed    Call MD for:  persistant dizziness or light-headedness   Complete by: As directed    Call MD for:  persistant nausea and vomiting   Complete by: As directed    Call MD for:  severe uncontrolled pain   Complete  by: As directed    Call MD for:  temperature >100.4   Complete by: As directed    Diet - low sodium heart healthy   Complete by: As directed    Discharge instructions   Complete by: As directed    Please be sure to follow-up with your primary care provider.  Take your medications as prescribed.  You were cared for by a hospitalist during your hospital stay. If you have any questions about your discharge medications or the care you received while you were in the hospital after you are discharged, you can call the unit and asked to speak with the hospitalist on call if the hospitalist that took care of you is not available. Once you are discharged, your primary care  physician will handle any further medical issues. Please note that NO REFILLS for any discharge medications will be authorized once you are discharged, as it is imperative that you return to your primary care physician (or establish a relationship with a primary care physician if you do not have one) for your aftercare needs so that they can reassess your need for medications and monitor your lab values. If you do not have a primary care physician, you can call [phone] for a physician referral.   Increase activity slowly   Complete by: As directed          Allergies as of 12/19/2023       Reactions   Latex Hives   Other Hives   Plastic         Medication List     TAKE these medications    acetaminophen 500 MG tablet Commonly known as: TYLENOL Take 500 mg by mouth every 6 (six) hours as needed for mild pain or headache.   ALPRAZolam 0.5 MG tablet Commonly known as: XANAX 0.5 mg daily as needed (for panic attacks).   amoxicillin-clavulanate 875-125 MG tablet Commonly known as: AUGMENTIN Take 1 tablet by mouth every 12 (twelve) hours for 5 days.   amphetamine-dextroamphetamine 20 MG 24 hr capsule Commonly known as: ADDERALL XR Take 20 mg by mouth every morning.   cloNIDine 0.1 MG tablet Commonly known as: CATAPRES Take 0.1 mg by mouth at bedtime.   hydrOXYzine 25 MG tablet Commonly known as: ATARAX Take 25 mg by mouth every 6 (six) hours.   magnesium oxide 400 MG tablet Commonly known as: MAG-OX Take 400 mg by mouth daily.   pantoprazole 40 MG tablet Commonly known as: Protonix Take 1 tablet (40 mg total) by mouth daily for 14 days.   saccharomyces boulardii 250 MG capsule Commonly known as: Florastor Take 1 capsule (250 mg total) by mouth 2 (two) times daily for 14 days.   sertraline 100 MG tablet Commonly known as: ZOLOFT Take 200 mg by mouth every morning.          Follow-up Information     Tetter, Devin B, NP. Schedule an appointment as soon as  possible for a visit in 1 week(s).   Specialty: Nurse Practitioner Why: post hospitalization follow up Contact information: [address] [place] [address] [phone]                 TOTAL DISCHARGE TIME: 35 minutes  Brinleigh Tew Rito Ehrlich  [facility] Pager on [email or web address]  12/19/2023, 10:55 AM

## 2023-12-19 NOTE — Plan of Care (Signed)

## 2024-06-13 ENCOUNTER — Other Ambulatory Visit (HOSPITAL_COMMUNITY): Payer: Self-pay

## 2024-07-25 ENCOUNTER — Encounter: Payer: Self-pay | Admitting: Physician Assistant

## 2024-08-05 ENCOUNTER — Other Ambulatory Visit (HOSPITAL_BASED_OUTPATIENT_CLINIC_OR_DEPARTMENT_OTHER): Payer: Self-pay

## 2024-08-05 DIAGNOSIS — D171 Benign lipomatous neoplasm of skin and subcutaneous tissue of trunk: Secondary | ICD-10-CM

## 2024-08-28 ENCOUNTER — Ambulatory Visit (INDEPENDENT_AMBULATORY_CARE_PROVIDER_SITE_OTHER)
Admission: RE | Admit: 2024-08-28 | Discharge: 2024-08-28 | Disposition: A | Discharge status: Home / Self Care | Payer: MEDICAID | Source: Ambulatory Visit

## 2024-08-28 DIAGNOSIS — D171 Benign lipomatous neoplasm of skin and subcutaneous tissue of trunk: Secondary | ICD-10-CM | POA: Diagnosis not present

## 2024-10-04 ENCOUNTER — Ambulatory Visit: Admitting: Physician Assistant

## 2024-11-03 NOTE — Progress Notes (Unsigned)
 "     [name], PA-C [address] [place], [place]  [address] Phone: [phone]   Gastroenterology Consultation  Referring Provider:     Nestor Elston NOVAK, NP Primary Care Physician:  Steva Sharan NOVAK, NP Primary Gastroenterologist:  [name], PA-C / Elspeth Naval, MD  Reason for Consultation:     Multiple GI symptoms.  Nausea, Vomiting, Diarrhea        HPI:   Discussed the use of AI scribe software for clinical note transcription with the patient, who gave verbal consent to proceed.  Marilyn Richardson is a 29 year old female with prior cholecystectomy who presents for evaluation of chronic recurrent vomiting and abdominal pain.  New patient.  Referred from [facility].    No Previous EGD, colonoscopy, or GI evaluation.   02/2024 abdominal pelvic CT with contrast: 1. No acute intra-abdominal or pelvic pathology.  2. Cholecystectomy.  3. There is no bowel obstruction or active inflammation.   12/2023 CT abdomen pelvis with contrast: 1. Question mild colitis of the transverse and descending colon versus underdistention. 2. Otherwise no acute abnormality in the abdomen or pelvis.  06/2024 labs: Celiac panel negative.  CRP normal.  TSH, T3 and T4 normal.  CMP normal.  CBC normal.  Hemoglobin 13.2.  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 History of Present Illness Recurrent Vomiting and Nausea: - Severe vomiting episodes occur three to four times per year, each lasting approximately one week - Vomiting characterized by persistent dry heaving, sometimes producing acid and foam - Episodes often require emergency room visits for intravenous antiemetics; oral medications are ineffective during episodes - Nausea frequently accompanies abdominal pain and often precedes vomiting - Vomiting  episodes frequently occur around the menstrual cycle - Previously diagnosed with cannabinoid hyperemesis syndrome; discontinued marijuana use but vomiting persisted - Currently smokes marijuana occasionally, particularly during periods of increased stress  Abdominal Pain and Altered Bowel Habits: - Abdominal pain consistently associated with bowel movements - Pain typically located in the mid to upper abdomen, occasionally lower abdomen - Pain is severe, causing her to bend over, rock, sweat, and pant - Alternating constipation and diarrhea; never has normal bowel movements - Nausea sometimes accompanies pain - No blood in stool - Suspects hemorrhoids but has not had recent intervention  Weight Loss and Nutritional Status: - Weight loss over the past year, attributed to the death of her fianc in 2024-03-08- Weight has fluctuated in the past - Recent thyroid  testing was normal  Prior Surgical and Diagnostic History: - Underwent cholecystectomy in 2022 after an episode of severe vomiting and abdominal pain; imaging revealed cysts and polyps in the gallbladder - Symptoms persisted after cholecystectomy - Abdominal and pelvic CT in March 2025 showed possible mild colitis; repeat CT in May 2025 showed no evidence of colitis - Celiac serologies negative in September 2025 - Thyroid , hemoglobin, kidney, and liver function tests have been normal - No history of upper endoscopy or colonoscopy - Previously used Pepto Bismol for some relief but discontinued  - Recent removal of a lipoma from her back  Gastrointestinal Symptoms Not Present: - No heartburn, acid reflux, or dysphagia  Impact on Daily Life: - Symptoms significantly impact daily functioning, including ability to care for her four-year-old son     Past Medical History:  Diagnosis Date   Anxiety    Depression  doing good   Endometriosis    Hidradenitis    Infection    UTI   Medical history non-contributory    PCOS  (polycystic ovarian syndrome)     Past Surgical History:  Procedure Laterality Date   LIPOMA EXCISION Left 11/04/2024   NO PAST SURGERIES     WISDOM TOOTH EXTRACTION      Prior to Admission medications  Medication Sig Start Date End Date Taking? Authorizing Provider  acetaminophen  (TYLENOL ) 500 MG tablet Take 500 mg by mouth every 6 (six) hours as needed for mild pain or headache.    [provider]  ALPRAZolam (XANAX) 0.5 MG tablet 0.5 mg daily as needed (for panic attacks). 09/13/23   [provider]  amphetamine-dextroamphetamine (ADDERALL XR) 20 MG 24 hr capsule Take 20 mg by mouth every morning. 12/11/23   [provider]  cloNIDine (CATAPRES) 0.1 MG tablet Take 0.1 mg by mouth at bedtime. 11/09/23   [provider]  hydrOXYzine  (ATARAX ) 25 MG tablet Take 25 mg by mouth every 6 (six) hours.    [provider]  magnesium  oxide (MAG-OX) 400 MG tablet Take 400 mg by mouth daily. 11/02/23   [provider]  pantoprazole  (PROTONIX ) 40 MG tablet Take 1 tablet (40 mg total) by mouth daily for 14 days. 12/19/23 01/02/24  Krishnan, Gokul, MD  sertraline (ZOLOFT) 100 MG tablet Take 200 mg by mouth every morning.    [provider]    Family History  Problem Relation Age of Onset   Factor V Leiden deficiency Mother    Factor V Leiden deficiency Maternal Aunt    Depression Father    Factor V Leiden deficiency Maternal Grandmother      Social History[1]  Allergies as of 11/08/2024 - Review Complete 11/08/2024  Allergen Reaction Noted   Latex Hives 05/18/2020   Other Hives 06/10/2016    Review of Systems:    All systems reviewed and negative except where noted in HPI.   Physical Exam:  BP 112/60   Pulse 92   Ht 5' 5 (1.651 m)   Wt 170 lb 4 oz (77.2 kg)   BMI 28.33 kg/m  No LMP recorded.  General:   Alert,  Well-developed, well-nourished, pleasant and cooperative in NAD Lungs:  Respirations even and unlabored.  Clear  throughout to auscultation.   No wheezes, crackles, or rhonchi. No acute distress. Heart:  Regular rate and rhythm; no murmurs, clicks, rubs, or gallops. Abdomen:  Normal bowel sounds.  No bruits.  Soft, and non-distended without masses, hepatosplenomegaly or hernias noted.  No Tenderness.  No guarding or rebound tenderness.    Neurologic:  Alert and oriented x3;  grossly normal neurologically. Psych:  Alert and cooperative. Normal mood and affect.   Imaging Studies: No results found.  Labs: CBC    Component Value Date/Time   WBC 8.2 12/19/2023 0800   RBC 3.48 (L) 12/19/2023 0800   HGB 10.7 (L) 12/19/2023 0800   HCT 31.5 (L) 12/19/2023 0800   PLT 196 12/19/2023 0800   MCV 90.5 12/19/2023 0800    CMP     Component Value Date/Time   NA 142 12/19/2023 0800   K 3.5 12/19/2023 0800   CL 109 12/19/2023 0800   CO2 26 12/19/2023 0800   GLUCOSE 103 (H) 12/19/2023 0800   BUN <5 (L) 12/19/2023 0800   CREATININE 0.77 12/19/2023 0800   CALCIUM 8.4 (L) 12/19/2023 0800   PROT 7.3 12/17/2023 2217   ALBUMIN 4.2 12/17/2023  2217   AST 33 12/17/2023 2217   ALT 19 12/17/2023 2217   ALKPHOS 59 12/17/2023 2217   BILITOT 0.9 12/17/2023 2217   GFRNONAA >60 12/19/2023 0800   GFRAA >60 09/10/2019 1351    Assessment and Plan:   Priyal S Blades is a 29 y.o. y/o female has been referred for:   Nausea / Vomiting: Episodic for 10 years Epigastric Pain Abnormal CT of colon: Non-specific Colitis Irregular bowel habits:  Diarrhea alternating with Constipation History of Hemorrhoids:  Currently Assymptomatic  I am suspicious for GERD, Gastritis, IBS, Functional GI disease.  IBD, Gastritis, H. Pylori, and PUD are in the differential.  I am scheduling EGD and Colonoscopy for further evaluation.  Encourage patient to avoid marijuana use to help prevent hyperemesis cannabis syndrome.  She expressed understanding.  Plan: - Scheduling EGD & Colonoscopy I discussed risks of EGD and colonoscopy with  patient to include risk of bleeding, perforation, and risk of sedation.  Patient expressed understanding and agrees to proceed with procedures.    Assessment & Plan Chronic recurrent vomiting and abdominal pain, possible irritable bowel syndrome versus cyclic vomiting syndrome Chronic vomiting and abdominal pain with alternating bowel habits. Workup for celiac and thyroid  disease negative; evaluation for inflammatory bowel disease inconclusive. Differential includes IBS, cyclic vomiting syndrome, less likely IBD. Endoscopy and colonoscopy needed for diagnosis. - Scheduled upper endoscopy and colonoscopy to evaluate for gastritis, peptic ulcer disease, H. pylori infection, Crohn's disease, ulcerative colitis, and other gastrointestinal pathology. - Provided instructions on pre-procedure preparation, including liquid diet and bowel preparation. - Both procedures will be performed under sedation on the same day. - Arranged follow-up after procedures to review results and discuss further management.  Hemorrhoids Hemorrhoids likely due to chronic bowel irregularities, with symptoms but no bleeding or acute complications. Evaluation during colonoscopy planned. - Hemorrhoids will be evaluated during colonoscopy. - Discussed that internal hemorrhoid banding can be considered in the office after colonoscopy if indicated. - Advised that external hemorrhoidectomy is a major surgery with significant recovery and is not recommended unless absolutely necessary. - Offered prescription creams or suppositories for symptomatic relief if needed, and patient declined.   Follow up 4 weeks after procedures with TG.  [name], PA-C       [1]  Social History Tobacco Use   Smoking status: Former    Current packs/day: 0.00    Average packs/day: 1 pack/day for 5.0 years (5.0 ttl pk-yrs)    Types: Cigarettes    Start date: 05/12/2013    Quit date: 05/12/2018    Years since quitting: 6.4   Smokeless tobacco:  Never   Tobacco comments:    dec 2019  Vaping Use   Vaping status: Former   Quit date: 12/13/2018  Substance Use Topics   Alcohol use: Not Currently   Drug use: Yes    Types: Marijuana    Comment: daily   "

## 2024-11-08 ENCOUNTER — Ambulatory Visit: Admitting: Physician Assistant

## 2024-11-08 ENCOUNTER — Encounter: Payer: Self-pay | Admitting: Physician Assistant

## 2024-11-08 VITALS — BP 112/60 | HR 92 | Ht 65.0 in | Wt 170.2 lb

## 2024-11-08 DIAGNOSIS — K5900 Constipation, unspecified: Secondary | ICD-10-CM | POA: Diagnosis not present

## 2024-11-08 DIAGNOSIS — R198 Other specified symptoms and signs involving the digestive system and abdomen: Secondary | ICD-10-CM

## 2024-11-08 DIAGNOSIS — R197 Diarrhea, unspecified: Secondary | ICD-10-CM | POA: Diagnosis not present

## 2024-11-08 DIAGNOSIS — R112 Nausea with vomiting, unspecified: Secondary | ICD-10-CM

## 2024-11-08 DIAGNOSIS — R933 Abnormal findings on diagnostic imaging of other parts of digestive tract: Secondary | ICD-10-CM | POA: Diagnosis not present

## 2024-11-08 DIAGNOSIS — K529 Noninfective gastroenteritis and colitis, unspecified: Secondary | ICD-10-CM

## 2024-11-08 DIAGNOSIS — Z8719 Personal history of other diseases of the digestive system: Secondary | ICD-10-CM | POA: Diagnosis not present

## 2024-11-08 DIAGNOSIS — R1013 Epigastric pain: Secondary | ICD-10-CM | POA: Diagnosis not present

## 2024-11-08 MED ORDER — NA SULFATE-K SULFATE-MG SULF 17.5-3.13-1.6 GM/177ML PO SOLN: 1.0000 | Freq: Once | ORAL | 0 refills | Status: AC

## 2024-11-08 NOTE — Patient Instructions (Signed)
 You have been scheduled for an endoscopy and colonoscopy. Please follow the written instructions given to you at your visit today.  If you use inhalers (even only as needed), please bring them with you on the day of your procedure.  DO NOT TAKE 7 DAYS PRIOR TO TEST- Trulicity (dulaglutide) Ozempic, Wegovy (semaglutide) Mounjaro, Zepbound (tirzepatide) Bydureon Bcise (exanatide extended release)  DO NOT TAKE 1 DAY PRIOR TO YOUR TEST Rybelsus (semaglutide) Adlyxin (lixisenatide) Victoza (liraglutide) Byetta (exanatide) ___________________________________________________________________________  Thank you for trusting me with your gastrointestinal care!   [name], PA-C   _______________________________________________________  If your blood pressure at your visit was 140/90 or greater, please contact your primary care physician to follow up on this.  _______________________________________________________  If you are age 98 or older, your body mass index should be between 23-30. Your Body mass index is 28.33 kg/m. If this is out of the aforementioned range listed, please consider follow up with your Primary Care Provider.  If you are age 70 or younger, your body mass index should be between 19-25. Your Body mass index is 28.33 kg/m. If this is out of the aformentioned range listed, please consider follow up with your Primary Care Provider.   ________________________________________________________  The [facility] providers would like to encourage you to use MYCHART to communicate with providers for non-urgent requests or questions.  Due to long hold times on the telephone, sending your provider a message by [facility] may be a faster and more efficient way to get a response.  Please allow 48 business hours for a response.  Please remember that this is for non-urgent requests.  _______________________________________________________  [facility] is using a team-based  approach to care.  Your team is made up of your doctor and two to three APPS. Our APPS (Nurse Practitioners and Physician Assistants) work with your physician to ensure care continuity for you. They are fully qualified to address your health concerns and develop a treatment plan. They communicate directly with your gastroenterologist to care for you. Seeing the Advanced Practice Practitioners on your physician's team can help you by facilitating care more promptly, often allowing for earlier appointments, access to diagnostic testing, procedures, and other specialty referrals.

## 2024-11-10 NOTE — Progress Notes (Signed)
 Agree with assessment and plan as outlined.

## 2024-12-09 ENCOUNTER — Encounter: Admitting: Gastroenterology

## 2025-01-06 ENCOUNTER — Ambulatory Visit: Admitting: Physician Assistant
